# Patient Record
Sex: Female | Born: 1990 | Hispanic: Yes | Marital: Married | Smoking: Former smoker
Health system: Southern US, Community
[De-identification: ages and names within clinical notes are randomized; demographics above are authoritative.]

## PROBLEM LIST (undated history)

## (undated) ENCOUNTER — Inpatient Hospital Stay (HOSPITAL_COMMUNITY): Payer: Self-pay

## (undated) DIAGNOSIS — T7840XA Allergy, unspecified, initial encounter: Secondary | ICD-10-CM

## (undated) HISTORY — PX: NO PAST SURGERIES: SHX2092

## (undated) HISTORY — DX: Allergy, unspecified, initial encounter: T78.40XA

---

## 2012-09-15 ENCOUNTER — Ambulatory Visit: Payer: Self-pay | Admitting: Family Medicine

## 2013-03-01 ENCOUNTER — Other Ambulatory Visit (HOSPITAL_COMMUNITY)
Admission: RE | Admit: 2013-03-01 | Discharge: 2013-03-01 | Disposition: A | Discharge status: Home / Self Care | Payer: Medicaid Other | Source: Ambulatory Visit | Attending: Family Medicine | Admitting: Family Medicine

## 2013-03-01 ENCOUNTER — Ambulatory Visit: Payer: Medicaid Other | Attending: Family Medicine | Admitting: Family Medicine

## 2013-03-01 ENCOUNTER — Encounter: Payer: Self-pay | Admitting: Family Medicine

## 2013-03-01 VITALS — BP 125/78 | HR 71 | Temp 98.5°F | Resp 14 | Ht 61.0 in | Wt 185.0 lb

## 2013-03-01 DIAGNOSIS — Z7253 High risk bisexual behavior: Secondary | ICD-10-CM | POA: Insufficient documentation

## 2013-03-01 DIAGNOSIS — Z7251 High risk heterosexual behavior: Secondary | ICD-10-CM

## 2013-03-01 DIAGNOSIS — N760 Acute vaginitis: Secondary | ICD-10-CM | POA: Insufficient documentation

## 2013-03-01 DIAGNOSIS — N898 Other specified noninflammatory disorders of vagina: Secondary | ICD-10-CM

## 2013-03-01 DIAGNOSIS — Z309 Encounter for contraceptive management, unspecified: Secondary | ICD-10-CM

## 2013-03-01 DIAGNOSIS — N920 Excessive and frequent menstruation with regular cycle: Secondary | ICD-10-CM

## 2013-03-01 LAB — CBC WITH DIFFERENTIAL/PLATELET
Basophils Absolute: 0 10*3/uL (ref 0.0–0.1)
Basophils Relative: 0 % (ref 0–1)
HCT: 38.8 % (ref 36.0–46.0)
MCHC: 34 g/dL (ref 30.0–36.0)
Monocytes Absolute: 0.3 10*3/uL (ref 0.1–1.0)
Neutro Abs: 3 10*3/uL (ref 1.7–7.7)
Neutrophils Relative %: 59 % (ref 43–77)
RDW: 13.9 % (ref 11.5–15.5)

## 2013-03-01 LAB — POCT URINALYSIS DIPSTICK
Leukocytes, UA: NEGATIVE
Nitrite, UA: NEGATIVE
Protein, UA: NEGATIVE
Urobilinogen, UA: 1
pH, UA: 7.5

## 2013-03-01 LAB — HIV ANTIBODY (ROUTINE TESTING W REFLEX): HIV: NONREACTIVE

## 2013-03-01 MED ORDER — NORGESTIM-ETH ESTRAD TRIPHASIC 0.18/0.215/0.25 MG-35 MCG PO TABS: 1.0000 | ORAL_TABLET | Freq: Every day | ORAL | Status: DC

## 2013-03-01 NOTE — Progress Notes (Signed)
Subjective:     Patient ID: Dominique Stewart, female   DOB: 1991/04/29, 22 y.o.   MRN: [id number]  HPI Pt here to establish care.  She initially said her primary concern was needing a "pap smear" which actually turned out to be that she never returned to the [facility] doctor for her 6 week check up after her delivery last July. On further discussion, it turne dout her main concern today is actually that she and the father of her baby had a 3 month breakup within the past year and he was sleeping with other women so she is worried about STDs. Since that time, they have gotten back together, had unprotected sex, and neither has been checked for stds.   Her delivery was, per her, uncomplicated nsvd, full term baby girl. She breastfed for 1 month and then switched to formula, at which time her periods returned. They are q monthly now and had been quite heavy for the first 6 mos but now are lightening back up toward the flow she had pre-pregnancy. She uses super tampons but they last 4-6 hours each.   She has had some increased discharge the past 2 mos and noticed a fishy odor. No abnormal bleeding or pain.    Review of Systems per hpi     Objective:   Physical Exam  Nursing note and vitals reviewed. Constitutional: She appears well-developed and well-nourished.  Cardiovascular: Normal rate, regular rhythm and normal heart sounds.   Pulmonary/Chest: Effort normal and breath sounds normal.  Abdominal: Soft.  Genitourinary: Uterus normal. Vaginal discharge found.  Psychiatric: She has a normal mood and affect.       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 Checking cbc today to eval for anemia.  Starting on trisprintec for now - she is interested in nexplanon as well.   No pap needed until age 51 Will request records, as she is unsure of her immunization status - specifically want to ensure pertussis and mmr utd.  Will have her rtc in 2-3 mos, earlier if needed.

## 2013-03-01 NOTE — Progress Notes (Signed)
Patient asks for pelvic exam. Has not had menstruhal cycle since giving birth to daughter 1 year ago. Denies cramping or spotting. States has not breast fed since daughter was 52 month old.

## 2013-03-02 LAB — ACUTE HEP PANEL AND HEP B SURFACE AB
HCV Ab: NEGATIVE
Hep A IgM: NEGATIVE

## 2013-03-03 LAB — GC/CHLAMYDIA PROBE AMP: GC Probe RNA: NEGATIVE

## 2013-03-05 ENCOUNTER — Telehealth: Payer: Self-pay | Admitting: *Deleted

## 2013-03-05 NOTE — Telephone Encounter (Signed)
03/05/13 Attempt to reach patient unavailable at number  [phone] or [phone] Dominique Stewart

## 2013-03-06 ENCOUNTER — Telehealth: Payer: Self-pay | Admitting: *Deleted

## 2013-03-06 DIAGNOSIS — A749 Chlamydial infection, unspecified: Secondary | ICD-10-CM

## 2013-03-06 MED ORDER — AZITHROMYCIN 250 MG PO TABS: ORAL_TABLET | ORAL | Status: DC

## 2013-03-06 NOTE — Progress Notes (Signed)
03/06/13 Spoke with patient regarding lab made aware Chlamydia test came back positive.Patient is not allergic To azithromycin. Patient stated to send prescription to [facility] on [address]  . [phone]. Patient informed she will need to take 1000mg  all at once and her partner need to be treated as well . Patient informed  NOT to have intercourse with her partner until she and they have been treated.Patient informed if symptoms persist She should let us know so we can do a test of cure/retreat if needed.Verbalize and understands.Patient also stated That her husband is a patient here and was seen at the clinic on Friday 03/02/13 . P.[facility] BSN [facility]

## 2013-03-06 NOTE — Telephone Encounter (Signed)
Please let pt know I have sent in her azithromycin. What is husband's status on treatment? If he has not been treated yet, he needs to be. He can speak with [place] on the phone to verify his information and we can send in his prescription if he would like to do so.

## 2013-03-06 NOTE — Telephone Encounter (Signed)
03/06/13 Spoke with patient and made aware Chlamydia test came back positive. Patient stated  She is not  Allergic to Azithromycin . She use [facility] on [address] [phone] . Need to take 1000mg  All at once one time and her partner need to be treated as well and not th have  Intercourse with them until she  And partner have been treated.If symptoms persist she should let us know  So we can do a test of cure/retreat If needed.Patient verbalize and understands.Patient also stated that her husband was seen her at  This clinic on Friday  03/02/13 please make doctor aware. P.Dellas Guard,RN BSN MHA

## 2013-03-06 NOTE — Telephone Encounter (Signed)
Pt called back. °

## 2013-03-07 NOTE — Telephone Encounter (Signed)
03/07/13 Spoke with patient made aware that her prescription had been sent in to [facility] on [address] Have her husband call the clinic to verify information per Dr. Lucretia Roers and she will call him in a prescription for treatment. Patient stated husband will call back today around 12:30 . Sherlene Shams BSN MHA

## 2013-03-14 ENCOUNTER — Other Ambulatory Visit: Payer: Self-pay | Admitting: Family Medicine

## 2013-03-14 DIAGNOSIS — N760 Acute vaginitis: Secondary | ICD-10-CM

## 2013-03-14 MED ORDER — METRONIDAZOLE 500 MG PO TABS: 500.0000 mg | ORAL_TABLET | Freq: Two times a day (BID) | ORAL | Status: DC

## 2013-03-16 ENCOUNTER — Telehealth: Payer: Self-pay | Admitting: *Deleted

## 2013-03-16 NOTE — Telephone Encounter (Signed)
03/16/13 Unable to reach patient at number provided. P.Verenis Nicosia,RN BSN MHA

## 2013-03-19 ENCOUNTER — Telehealth: Payer: Self-pay | Admitting: *Deleted

## 2013-03-19 NOTE — Telephone Encounter (Signed)
03/19/13 Patient made aware of bacterial infection prescription called into  [facility] on [address] per pt. Request. P.Gray Maugeri,RN BSN MHA

## 2013-03-21 ENCOUNTER — Ambulatory Visit: Payer: Medicaid Other

## 2013-04-11 ENCOUNTER — Ambulatory Visit: Payer: Medicaid Other

## 2013-06-14 ENCOUNTER — Encounter (HOSPITAL_COMMUNITY): Payer: Self-pay | Admitting: *Deleted

## 2013-06-14 ENCOUNTER — Inpatient Hospital Stay (HOSPITAL_COMMUNITY): Payer: Medicaid Other

## 2013-06-14 ENCOUNTER — Inpatient Hospital Stay (HOSPITAL_COMMUNITY)
Admission: AD | Admit: 2013-06-14 | Discharge: 2013-06-14 | Disposition: A | Discharge status: Home / Self Care | Payer: Medicaid Other | Source: Ambulatory Visit | Attending: Obstetrics & Gynecology | Admitting: Obstetrics & Gynecology

## 2013-06-14 DIAGNOSIS — O368190 Decreased fetal movements, unspecified trimester, not applicable or unspecified: Secondary | ICD-10-CM | POA: Insufficient documentation

## 2013-06-14 DIAGNOSIS — O0932 Supervision of pregnancy with insufficient antenatal care, second trimester: Secondary | ICD-10-CM

## 2013-06-14 NOTE — MAU Provider Note (Signed)
  History   Dominique Stewart is a 22 year old G2P1000 currently [redacted]w[redacted]d presenting for lack of fetal movement. She states that she has not felt movement at all during her pregnancy and was concerned because she felt fetal movements as early as 17 weeks in her previous pregnancy. She has not had prenatal care yet and is scheduled for her first prenatal visit here on October 26th. She denies any abdominal pain, nausea, vomiting, urinary symptoms, vaginal bleeding, or discharge  CSN: [id number]  Arrival date and time: 06/14/13 4696   First Provider Initiated Contact with Patient 06/14/13 1002      No chief complaint on file.  HPI    Past Medical History  Diagnosis Date  . Allergy     History reviewed. No pertinent past surgical history.  Family History  Problem Relation Age of Onset  . Cancer Paternal Grandmother   . Diabetes Paternal Grandmother     History  Substance Use Topics  . Smoking status: Former Games developer  . Smokeless tobacco: Not on file  . Alcohol Use: No    Allergies: No Known Allergies  Prescriptions prior to admission  Medication Sig Dispense Refill  . Prenatal Vit-Fe Fumarate-FA (PRENATAL MULTIVITAMIN) TABS tablet Take 1 tablet by mouth daily at 12 noon.        Review of Systems  Constitutional: Negative for fever and chills.  Eyes: Negative for blurred vision.  Respiratory: Negative for cough and shortness of breath.   Cardiovascular: Negative for chest pain and leg swelling.  Gastrointestinal: Positive for heartburn. Negative for nausea, vomiting, abdominal pain and diarrhea.  Genitourinary: Negative for dysuria, urgency, frequency, hematuria and flank pain.  Neurological: Negative for dizziness and headaches.   Physical Exam   Blood pressure 124/64, pulse 96, temperature 97.9 F (36.6 C), temperature source Oral, resp. rate 18, height 5' (1.524 m), weight 79.833 kg (176 lb), last menstrual period 02/01/2012, SpO2 98.00%.  Physical Exam  Nursing note and  vitals reviewed. Constitutional: She is oriented to person, place, and time. She appears well-developed and well-nourished. No distress.  HENT:  Head: Normocephalic.  Eyes: Pupils are equal, round, and reactive to light.  Neck: Normal range of motion. Neck supple.  Cardiovascular: Normal rate, regular rhythm, normal heart sounds and intact distal pulses.   Respiratory: Effort normal and breath sounds normal.  GI: Soft. Bowel sounds are normal. There is no tenderness.  Gravid  FHR 155 bpm with doppler  Musculoskeletal: Normal range of motion.  Neurological: She is alert and oriented to person, place, and time.  Skin: Skin is warm and dry.  Psychiatric: She has a normal mood and affect.    MAU Course  Procedures MDM Ultrasound performed for dating- results not available at time of discharge Pt will call back for results to see when anatomy scan should be performed Preliminary report measure [redacted]w[redacted]d, FHR 150 bpm; placenta posterior above cervical os Discussed report with pt on phone-order for anatomy scan put in - radiology will call pt for appt  Pt will keep appointment in OB clinic on Oct 23 Assessment and Plan  Assessment: Single living IUP [redacted]w[redacted]d Will schedule out pt ultrasound for anatomy scan~ Sept 22- ultrasound to call pt Will have pt to f/u at [facility] Pamelia Hoit, WHNP-BC  Pt seen with Coy Saunas, PA student and agree with his assessment and plan Coy Saunas 06/14/2013, 10:04 AM

## 2013-06-14 NOTE — MAU Note (Signed)
Patient reports to MAU with c/o of "not feeling my baby move". Denies bleeding or LOF.

## 2013-06-21 ENCOUNTER — Encounter: Payer: Self-pay | Admitting: *Deleted

## 2013-06-21 DIAGNOSIS — O0930 Supervision of pregnancy with insufficient antenatal care, unspecified trimester: Secondary | ICD-10-CM | POA: Insufficient documentation

## 2013-06-22 ENCOUNTER — Telehealth: Payer: Self-pay | Admitting: *Deleted

## 2013-06-22 DIAGNOSIS — O0931 Supervision of pregnancy with insufficient antenatal care, first trimester: Secondary | ICD-10-CM

## 2013-06-22 NOTE — Telephone Encounter (Signed)
Wave called and left a message stating she was seen In MAU and was told to schedule an anatamy ultrasound. Per review had limited ultrasound confirming dates , was [redacted] weeks pregnant. Has appt scheduled for clinic in October. Called [place] and states Darl Pikes, NP called her and told her to schedule [place]. Told her I would discuss with provider and we will call her back.

## 2013-06-22 NOTE — Telephone Encounter (Signed)
Scheduled [place] and called patient and gave her the appt.

## 2013-06-26 ENCOUNTER — Ambulatory Visit (HOSPITAL_COMMUNITY)
Admission: RE | Admit: 2013-06-26 | Discharge: 2013-06-26 | Disposition: A | Discharge status: Home / Self Care | Payer: Medicaid Other | Source: Ambulatory Visit | Attending: Gynecology | Admitting: Gynecology

## 2013-06-26 ENCOUNTER — Other Ambulatory Visit (HOSPITAL_COMMUNITY): Payer: Self-pay | Admitting: Gynecology

## 2013-06-26 DIAGNOSIS — O0932 Supervision of pregnancy with insufficient antenatal care, second trimester: Secondary | ICD-10-CM

## 2013-06-26 DIAGNOSIS — O0930 Supervision of pregnancy with insufficient antenatal care, unspecified trimester: Secondary | ICD-10-CM | POA: Insufficient documentation

## 2013-06-26 DIAGNOSIS — Z3689 Encounter for other specified antenatal screening: Secondary | ICD-10-CM | POA: Insufficient documentation

## 2013-06-28 ENCOUNTER — Other Ambulatory Visit (HOSPITAL_COMMUNITY): Payer: Medicaid Other

## 2013-07-18 ENCOUNTER — Encounter: Payer: Medicaid Other | Admitting: Advanced Practice Midwife

## 2013-07-31 ENCOUNTER — Other Ambulatory Visit (HOSPITAL_COMMUNITY)
Admission: RE | Admit: 2013-07-31 | Discharge: 2013-07-31 | Disposition: A | Discharge status: Home / Self Care | Payer: Medicaid Other | Source: Ambulatory Visit | Attending: Family Medicine | Admitting: Family Medicine

## 2013-07-31 ENCOUNTER — Ambulatory Visit (INDEPENDENT_AMBULATORY_CARE_PROVIDER_SITE_OTHER): Payer: Medicaid Other | Admitting: Family Medicine

## 2013-07-31 ENCOUNTER — Encounter: Payer: Self-pay | Admitting: Family Medicine

## 2013-07-31 VITALS — BP 117/71 | Temp 97.0°F | Wt 183.3 lb

## 2013-07-31 DIAGNOSIS — B86 Scabies: Secondary | ICD-10-CM

## 2013-07-31 DIAGNOSIS — O0932 Supervision of pregnancy with insufficient antenatal care, second trimester: Secondary | ICD-10-CM

## 2013-07-31 DIAGNOSIS — Z01419 Encounter for gynecological examination (general) (routine) without abnormal findings: Secondary | ICD-10-CM | POA: Insufficient documentation

## 2013-07-31 DIAGNOSIS — Z124 Encounter for screening for malignant neoplasm of cervix: Secondary | ICD-10-CM

## 2013-07-31 DIAGNOSIS — N898 Other specified noninflammatory disorders of vagina: Secondary | ICD-10-CM

## 2013-07-31 DIAGNOSIS — Z113 Encounter for screening for infections with a predominantly sexual mode of transmission: Secondary | ICD-10-CM

## 2013-07-31 DIAGNOSIS — Z23 Encounter for immunization: Secondary | ICD-10-CM

## 2013-07-31 DIAGNOSIS — O0930 Supervision of pregnancy with insufficient antenatal care, unspecified trimester: Secondary | ICD-10-CM

## 2013-07-31 LAB — POCT URINALYSIS DIP (DEVICE)
Bilirubin Urine: NEGATIVE
Glucose, UA: NEGATIVE mg/dL
Protein, ur: NEGATIVE mg/dL
Specific Gravity, Urine: 1.025 (ref 1.005–1.030)
Urobilinogen, UA: 0.2 mg/dL (ref 0.0–1.0)

## 2013-07-31 MED ORDER — TETANUS-DIPHTH-ACELL PERTUSSIS 5-2.5-18.5 LF-MCG/0.5 IM SUSP: 0.5000 mL | Freq: Once | INTRAMUSCULAR | Status: DC

## 2013-07-31 MED ORDER — PERMETHRIN 5 % EX CREA: 1.0000 "application " | TOPICAL_CREAM | Freq: Once | CUTANEOUS | Status: DC

## 2013-07-31 NOTE — Progress Notes (Signed)
P= 83 Pt. States she was seen two weeks ago at [facility] and was tested for HIV, gonhorrhea, syphillis and chlamydia. Pt. States she tested positive for chlamydia. Pt. States she was told to see a doctor.  New OB packet given to patient.  Discussed appropriate weight gain based on BMI for this pregancy; pt. Verbalizes understanding.  Pt. Consented to flu vacine and tdap.

## 2013-07-31 NOTE — Progress Notes (Signed)
Subjective:    Dominique Stewart is a G2P1000 [redacted]w[redacted]d being seen today for her first obstetrical visit.  Her obstetrical history is significant for late to care. Patient does intend to breast feed. Pregnancy history fully reviewed.  Patient reports backache, no bleeding, no contractions, no cramping and no leaking.  Pt with diffuse papular rash and very itchy.   Filed Vitals:   07/31/13 1312  BP: 117/71  Temp: 97 F (36.1 C)  Weight: 83.144 kg (183 lb 4.8 oz)    HISTORY: OB History  Gravida Para Term Preterm AB SAB TAB Ectopic Multiple Living  2 1 1            # Outcome Date GA Lbr Len/2nd Weight Sex Delivery Anes PTL Lv  2 CUR           1 TRM 03/28/12    F SVD        Past Medical History  Diagnosis Date  . Allergy    History reviewed. No pertinent past surgical history. Family History  Problem Relation Age of Onset  . Cancer Paternal Grandmother   . Diabetes Paternal Grandmother      Exam    Uterus:     Pelvic Exam:    Perineum: No Hemorrhoids   Vulva: normal   Vagina:  normal mucosa   pH:    Cervix: no bleeding following Pap and no cervical motion tenderness   Adnexa: normal adnexa and no mass, fullness, tenderness   Bony Pelvis: proven to 7lb 12oz  System:     Skin: normal coloration and turgor, pt with papular rash on hands and on legs.     Neurologic: oriented, normal, negative, normal mood, gait normal; reflexes normal and symmetric   Extremities: normal strength, tone, and muscle mass, no deformities   HEENT extra ocular movement intact and sclera clear, anicteric   Mouth/Teeth mucous membranes moist, pharynx normal without lesions   Neck supple and no masses   Cardiovascular: regular rate and rhythm, no murmurs or gallops   Respiratory:  appears well, vitals normal, no respiratory distress, acyanotic, normal RR, ear and throat exam is normal, neck free of mass or lymphadenopathy, chest clear, no wheezing, crepitations, rhonchi, normal symmetric air  entry   Abdomen: soft, non-tender; bowel sounds normal; no masses,  no organomegaly   Urinary: urethral meatus normal  FHT 140s 27cm    Assessment:    Pregnancy: G2P1000 Patient Active Problem List   Diagnosis Date Noted  . Insufficient prenatal care 06/21/2013        Plan:      Dominique Stewart is a 22 y.o. G2P1000 at [redacted]w[redacted]d here for ROB visit.  Pt reports +Chlymdia, will follow up with pap today and treat as needed  ?Scabies: will treat with permethrin and check back for improvement.  Discussed with Patient:  -Plans to breast feed.  All questions answered. -Continue prenatal vitamins. - Reviewed fetal kick counts (Pt to perform daily at a time when the baby is active, lie laterally with both hands on belly in quiet room and count all movements (hiccups, shoulder rolls, obvious kicks, etc); pt is to report to clinic or MAU for less than 10 movements felt in a one hour time period-pt told as soon as she counts 10 movements the count is complete.)  - Routine precautions discussed (depression, infection s/s).   Patient provided with all pertinent phone numbers for emergencies. - RTC for any VB, regular, painful cramps/ctxs occurring at a rate of >2/10  min, fever (100.5 or higher), n/v/d, any pain that is unresolving or worsening, LOF, decreased fetal movement, CP, SOB, edema - f/u 2 weeks for reevaluation for rash and GC/C treatment.  Problems: Patient Active Problem List   Diagnosis Date Noted  . Insufficient prenatal care 06/21/2013    To Do: 1. PNL, 2. PNV  [ ]  Vaccines: Flu: 11/4  Tdap:  [ ]  BCM: undecided  Edu: [ x] PTL precautions; [ ]  BF class; [ ]  childbirth class; [ ]   BF counseling;  Dominique Stewart 07/31/2013

## 2013-07-31 NOTE — Patient Instructions (Addendum)
Pregnancy - Second Trimester The second trimester of pregnancy (3 to 6 months) is a period of rapid growth for you and your baby. At the end of the sixth month, your baby is about 9 inches long and weighs 1 1/2 pounds. You will begin to feel the baby move between 18 and 20 weeks of the pregnancy. This is called quickening. Weight gain is faster. A clear fluid (colostrum) may leak out of your breasts. You may feel small contractions of the womb (uterus). This is known as false labor or Braxton-Hicks contractions. This is like a practice for labor when the baby is ready to be born. Usually, the problems with morning sickness have usually passed by the end of your first trimester. Some women develop small dark blotches (called cholasma, mask of pregnancy) on their face that usually goes away after the baby is born. Exposure to the sun makes the blotches worse. Acne may also develop in some pregnant women and pregnant women who have acne, may find that it goes away. PRENATAL EXAMS  Blood work may continue to be done during prenatal exams. These tests are done to check on your health and the probable health of your baby. Blood work is used to follow your blood levels (hemoglobin). Anemia (low hemoglobin) is common during pregnancy. Iron and vitamins are given to help prevent this. You will also be checked for diabetes between 24 and 28 weeks of the pregnancy. Some of the previous blood tests may be repeated.  The size of the uterus is measured during each visit. This is to make sure that the baby is continuing to grow properly according to the dates of the pregnancy.  Your blood pressure is checked every prenatal visit. This is to make sure you are not getting toxemia.  Your urine is checked to make sure you do not have an infection, diabetes or protein in the urine.  Your weight is checked often to make sure gains are happening at the suggested rate. This is to ensure that both you and your baby are  growing normally.  Sometimes, an ultrasound is performed to confirm the proper growth and development of the baby. This is a test which bounces harmless sound waves off the baby so your caregiver can more accurately determine due dates. Sometimes, a test is done on the amniotic fluid surrounding the baby. This test is called an amniocentesis. The amniotic fluid is obtained by sticking a needle into the belly (abdomen). This is done to check the chromosomes in instances where there is a concern about possible genetic problems with the baby. It is also sometimes done near the end of pregnancy if an early delivery is required. In this case, it is done to help make sure the baby's lungs are mature enough for the baby to live outside of the womb. CHANGES OCCURING IN THE SECOND TRIMESTER OF PREGNANCY Your body goes through many changes during pregnancy. They vary from person to person. Talk to your caregiver about changes you notice that you are concerned about.  During the second trimester, you will likely have an increase in your appetite. It is normal to have cravings for certain foods. This varies from person to person and pregnancy to pregnancy.  Your lower abdomen will begin to bulge.  You may have to urinate more often because the uterus and baby are pressing on your bladder. It is also common to get more bladder infections during pregnancy. You can help this by drinking lots of fluids   and emptying your bladder before and after intercourse.  You may begin to get stretch marks on your hips, abdomen, and breasts. These are normal changes in the body during pregnancy. There are no exercises or medicines to take that prevent this change.  You may begin to develop swollen and bulging veins (varicose veins) in your legs. Wearing support hose, elevating your feet for 15 minutes, 3 to 4 times a day and limiting salt in your diet helps lessen the problem.  Heartburn may develop as the uterus grows and  pushes up against the stomach. Antacids recommended by your caregiver helps with this problem. Also, eating smaller meals 4 to 5 times a day helps.  Constipation can be treated with a stool softener or adding bulk to your diet. Drinking lots of fluids, and eating vegetables, fruits, and whole grains are helpful.  Exercising is also helpful. If you have been very active up until your pregnancy, most of these activities can be continued during your pregnancy. If you have been less active, it is helpful to start an exercise program such as walking.  Hemorrhoids may develop at the end of the second trimester. Warm sitz baths and hemorrhoid cream recommended by your caregiver helps hemorrhoid problems.  Backaches may develop during this time of your pregnancy. Avoid heavy lifting, wear low heal shoes, and practice good posture to help with backache problems.  Some pregnant women develop tingling and numbness of their hand and fingers because of swelling and tightening of ligaments in the wrist (carpel tunnel syndrome). This goes away after the baby is born.  As your breasts enlarge, you may have to get a bigger bra. Get a comfortable, cotton, support bra. Do not get a nursing bra until the last month of the pregnancy if you will be nursing the baby.  You may get a dark line from your belly button to the pubic area called the linea nigra.  You may develop rosy cheeks because of increase blood flow to the face.  You may develop spider looking lines of the face, neck, arms, and chest. These go away after the baby is born. HOME CARE INSTRUCTIONS   It is extremely important to avoid all smoking, herbs, alcohol, and unprescribed drugs during your pregnancy. These chemicals affect the formation and growth of the baby. Avoid these chemicals throughout the pregnancy to ensure the delivery of a healthy infant.  Most of your home care instructions are the same as suggested for the first trimester of your  pregnancy. Keep your caregiver's appointments. Follow your caregiver's instructions regarding medicine use, exercise, and diet.  During pregnancy, you are providing food for you and your baby. Continue to eat regular, well-balanced meals. Choose foods such as meat, fish, milk and other low fat dairy products, vegetables, fruits, and whole-grain breads and cereals. Your caregiver will tell you of the ideal weight gain.  A physical sexual relationship may be continued up until near the end of pregnancy if there are no other problems. Problems could include early (premature) leaking of amniotic fluid from the membranes, vaginal bleeding, abdominal pain, or other medical or pregnancy problems.  Exercise regularly if there are no restrictions. Check with your caregiver if you are unsure of the safety of some of your exercises. The greatest weight gain will occur in the last 2 trimesters of pregnancy. Exercise will help you:  Control your weight.  Get you in shape for labor and delivery.  Lose weight after you have the baby.  Wear   a good support or jogging bra for breast tenderness during pregnancy. This may help if worn during sleep. Pads or tissues may be used in the bra if you are leaking colostrum.  Do not use hot tubs, steam rooms or saunas throughout the pregnancy.  Wear your seat belt at all times when driving. This protects you and your baby if you are in an accident.  Avoid raw meat, uncooked cheese, cat litter boxes, and soil used by cats. These carry germs that can cause birth defects in the baby.  The second trimester is also a good time to visit your dentist for your dental health if this has not been done yet. Getting your teeth cleaned is okay. Use a soft toothbrush. Brush gently during pregnancy.  It is easier to leak urine during pregnancy. Tightening up and strengthening the pelvic muscles will help with this problem. Practice stopping your urination while you are going to the  bathroom. These are the same muscles you need to strengthen. It is also the muscles you would use as if you were trying to stop from passing gas. You can practice tightening these muscles up 10 times a set and repeating this about 3 times per day. Once you know what muscles to tighten up, do not perform these exercises during urination. It is more likely to contribute to an infection by backing up the urine.  Ask for help if you have financial, counseling, or nutritional needs during pregnancy. Your caregiver will be able to offer counseling for these needs as well as refer you for other special needs.  Your skin may become oily. If so, wash your face with mild soap, use non-greasy moisturizer and oil or cream based makeup. MEDICINES AND DRUG USE IN PREGNANCY  Take prenatal vitamins as directed. The vitamin should contain 1 milligram of folic acid. Keep all vitamins out of reach of children. Only a couple vitamins or tablets containing iron may be fatal to a baby or young child when ingested.  Avoid use of all medicines, including herbs, over-the-counter medicines, not prescribed or suggested by your caregiver. Only take over-the-counter or prescription medicines for pain, discomfort, or fever as directed by your caregiver. Do not use aspirin.  Let your caregiver also know about herbs you may be using.  Alcohol is related to a number of birth defects. This includes fetal alcohol syndrome. All alcohol, in any form, should be avoided completely. Smoking will cause low birth rate and premature babies.  Street or illegal drugs are very harmful to the baby. They are absolutely forbidden. A baby born to an addicted mother will be addicted at birth. The baby will go through the same withdrawal an adult does. SEEK MEDICAL CARE IF:  You have any concerns or worries during your pregnancy. It is better to call with your questions if you feel they cannot wait, rather than worry about them. SEEK IMMEDIATE  MEDICAL CARE IF:   An unexplained oral temperature above 102 F (38.9 C) develops, or as your caregiver suggests.  You have leaking of fluid from the vagina (birth canal). If leaking membranes are suspected, take your temperature and tell your caregiver of this when you call.  There is vaginal spotting, bleeding, or passing clots. Tell your caregiver of the amount and how many pads are used. Light spotting in pregnancy is common, especially following intercourse.  You develop a bad smelling vaginal discharge with a change in the color from clear to white.  You continue to feel   sick to your stomach (nauseated) and have no relief from remedies suggested. You vomit blood or coffee ground-like materials.  You lose more than 2 pounds of weight or gain more than 2 pounds of weight over 1 week, or as suggested by your caregiver.  You notice swelling of your face, hands, feet, or legs.  You get exposed to [place] measles and have never had them.  You are exposed to fifth disease or chickenpox.  You develop belly (abdominal) pain. Round ligament discomfort is a common non-cancerous (benign) cause of abdominal pain in pregnancy. Your caregiver still must evaluate you.  You develop a bad headache that does not go away.  You develop fever, diarrhea, pain with urination, or shortness of breath.  You develop visual problems, blurry, or double vision.  You fall or are in a car accident or any kind of trauma.  There is mental or physical violence at home. Document Released: 09/07/2001 Document Revised: 06/07/2012 Document Reviewed: 03/12/2009 [facility] Patient Information 2014 [place], [place]. Scabies Scabies are small bugs (mites) that burrow under the skin and cause red bumps and severe itching. These bugs can only be seen with a microscope. Scabies are highly contagious. They can spread easily from person to person by direct contact. They are also spread through sharing clothing or linens that  have the scabies mites living in them. It is not unusual for an entire family to become infected through shared towels, clothing, or bedding.  HOME CARE INSTRUCTIONS  Your caregiver may prescribe a cream or lotion to kill the mites. If cream is prescribed, massage the cream into the entire body from the neck to the bottom of both feet. Also massage the cream into the scalp and face if your child is less than 38 year old. Avoid the eyes and mouth. Do not wash your hands after application. Leave the cream on for 8 to 12 hours. Your child should bathe or shower after the 8 to 12 hour application period. Sometimes it is helpful to apply the cream to your child right before bedtime. One treatment is usually effective and will eliminate approximately 95% of infestations. For severe cases, your caregiver may decide to repeat the treatment in 1 week. Everyone in your household should be treated with one application of the cream. New rashes or burrows should not appear within 24 to 48 hours after successful treatment. However, the itching and rash may last for 2 to 4 weeks after successful treatment. Your caregiver may prescribe a medicine to help with the itching or to help the rash go away more quickly. Scabies can live on clothing or linens for up to 3 days. All of your child's recently used clothing, towels, stuffed toys, and bed linens should be washed in hot water and then dried in a dryer for at least 20 minutes on high heat. Items that cannot be washed should be enclosed in a plastic bag for at least 3 days. To help relieve itching, bathe your child in a cool bath or apply cool washcloths to the affected areas. Your child may return to school after treatment with the prescribed cream. SEEK MEDICAL CARE IF:  The itching persists longer than 4 weeks after treatment. The rash spreads or becomes infected. Signs of infection include red blisters or yellow-tan crust. Document Released: 09/13/2005 Document  Revised: 12/06/2011 Document Reviewed: 01/22/2009 [facility] Patient Information 2014 [place], [place].

## 2013-08-01 ENCOUNTER — Encounter: Payer: Self-pay | Admitting: *Deleted

## 2013-08-01 LAB — PRESCRIPTION MONITORING PROFILE (19 PANEL)
Barbiturate Screen, Urine: NEGATIVE ng/mL
Benzodiazepine Screen, Urine: NEGATIVE ng/mL
Buprenorphine, Urine: NEGATIVE ng/mL
Fentanyl, Ur: NEGATIVE ng/mL
MDMA URINE: NEGATIVE ng/mL
Meperidine, Ur: NEGATIVE ng/mL
Nitrites, Initial: NEGATIVE ug/mL
Oxycodone Screen, Ur: NEGATIVE ng/mL
Propoxyphene: NEGATIVE ng/mL
Tramadol Scrn, Ur: NEGATIVE ng/mL
Zolpidem, Urine: NEGATIVE ng/mL
pH, Initial: 6.9 pH (ref 4.5–8.9)

## 2013-08-01 LAB — OBSTETRIC PANEL
Basophils Absolute: 0 10*3/uL (ref 0.0–0.1)
Basophils Relative: 0 % (ref 0–1)
Eosinophils Absolute: 0.3 10*3/uL (ref 0.0–0.7)
Eosinophils Relative: 3 % (ref 0–5)
HCT: 35.2 % — ABNORMAL LOW (ref 36.0–46.0)
Hemoglobin: 12.2 g/dL (ref 12.0–15.0)
Lymphocytes Relative: 27 % (ref 12–46)
MCV: 89.6 fL (ref 78.0–100.0)
Monocytes Relative: 7 % (ref 3–12)
Platelets: 275 10*3/uL (ref 150–400)
RDW: 13.7 % (ref 11.5–15.5)
WBC: 8.4 10*3/uL (ref 4.0–10.5)

## 2013-08-01 LAB — CULTURE, OB URINE
Colony Count: NO GROWTH
Organism ID, Bacteria: NO GROWTH

## 2013-08-01 LAB — ALCOHOL METABOLITE (ETG), URINE: Ethyl Glucuronide (EtG): NEGATIVE ng/mL

## 2013-08-01 LAB — HIV ANTIBODY (ROUTINE TESTING W REFLEX): HIV: NONREACTIVE

## 2013-08-02 ENCOUNTER — Telehealth: Payer: Self-pay | Admitting: Family Medicine

## 2013-08-02 MED ORDER — AZITHROMYCIN 500 MG PO TABS: 1000.0000 mg | ORAL_TABLET | Freq: Once | ORAL | Status: DC

## 2013-08-02 NOTE — Telephone Encounter (Signed)
+   Chlamydia - in need of tx. Message sent for clinical support. Azithryomycin 1000mg  x1  Tawana Scale, MD OB Fellow

## 2013-08-03 LAB — HEMOGLOBINOPATHY EVALUATION
Hgb A: 96.8 % (ref 96.8–97.8)
Hgb S Quant: 0 %

## 2013-08-03 MED ORDER — AZITHROMYCIN 500 MG PO TABS: ORAL_TABLET | ORAL | Status: DC

## 2013-08-03 NOTE — Telephone Encounter (Addendum)
Called pt and left message we are trying to reach her with results information. Please call back and state whether we may leave details of medical information on her voice mail.  Rx sent electronically to her pharmacy. STD form to be completed when pt has called back and confirmed. 11/11  1215  Called and spoke w/pt. I informed her of + Chlamydia test results and need for treatment.  I also advised that her husband needs treatment as well. They should both refrain from sexual intercourse for 2 weeks after their antibiotic treatment.  Pt also stated that she needs more cream for the scabies. I informed her that she has refills available from her pharmacy.  She thinks her husband is now getting scabies also. I advised her that he will need to be seen at the [facility] or Urgent Care.  Pt voiced understanding of all information and instructions given.   STD form completed and faxed to [facility].

## 2013-08-07 MED ORDER — AZITHROMYCIN 500 MG PO TABS: ORAL_TABLET | ORAL | Status: DC

## 2013-08-14 ENCOUNTER — Encounter: Payer: Medicaid Other | Admitting: Obstetrics and Gynecology

## 2013-09-04 ENCOUNTER — Encounter: Payer: Self-pay | Admitting: Family Medicine

## 2013-09-04 ENCOUNTER — Ambulatory Visit (INDEPENDENT_AMBULATORY_CARE_PROVIDER_SITE_OTHER): Payer: Medicaid Other | Admitting: Family Medicine

## 2013-09-04 VITALS — BP 119/80 | Wt 193.0 lb

## 2013-09-04 DIAGNOSIS — O0930 Supervision of pregnancy with insufficient antenatal care, unspecified trimester: Secondary | ICD-10-CM

## 2013-09-04 DIAGNOSIS — O0933 Supervision of pregnancy with insufficient antenatal care, third trimester: Secondary | ICD-10-CM

## 2013-09-04 LAB — CBC
HCT: 34.4 % — ABNORMAL LOW (ref 36.0–46.0)
MCH: 29.4 pg (ref 26.0–34.0)
MCHC: 33.4 g/dL (ref 30.0–36.0)
MCV: 88 fL (ref 78.0–100.0)
Platelets: 235 10*3/uL (ref 150–400)
RDW: 13.1 % (ref 11.5–15.5)
WBC: 8.2 10*3/uL (ref 4.0–10.5)

## 2013-09-04 LAB — POCT URINALYSIS DIP (DEVICE)
Hgb urine dipstick: NEGATIVE
Nitrite: NEGATIVE
Urobilinogen, UA: 0.2 mg/dL (ref 0.0–1.0)
pH: 6.5 (ref 5.0–8.0)

## 2013-09-04 NOTE — Progress Notes (Signed)
+  FM no lof, no vb, no ctx  Dominique Stewart is a 22 y.o. G2P1001 at [redacted]w[redacted]d here for ROB visit.  Discussed with Patient:  -Plans to bottle feed.  All questions answered. -Continue prenatal vitamins. -Reviewed fetal kick counts (Pt to perform daily at a time when the baby is active, lie laterally with both hands on belly in quiet room and count all movements (hiccups, shoulder rolls, obvious kicks, etc); pt is to report to clinic or [facility]U for less than 10 movements felt in a one hour time period-pt told as soon as she counts 10 movements the count is complete.)  - Routine precautions discussed (depression, infection s/s).   Patient provided with all pertinent phone numbers for emergencies. - RTC for any VB, regular, painful cramps/ctxs occurring at a rate of >2/10 min, fever (100.5 or higher), n/v/d, any pain that is unresolving or worsening, LOF, decreased fetal movement, CP, SOB, edema  Problems: Patient Active Problem List   Diagnosis Date Noted  . Insufficient prenatal care 06/21/2013    To Do: 1. Glucose tolerance test ordered.  Patient will draw in clinic.  Will f/u test and amend plan based on results. 2. CBC and antibody screen ordered.   [ ]  Vaccines: Flu: recd  Tdap: recd [ ]  BCM: mirena  Edu: [x ] PTL precautions; [ ]  BF class; [ ]  childbirth class; [ ]   BF counseling;

## 2013-09-04 NOTE — Progress Notes (Signed)
Pulse- 91 Patient reports pelvic pressure; also reports painful intercourse, states this occurs outside pregnancy as well

## 2013-09-04 NOTE — Patient Instructions (Signed)
Third Trimester of Pregnancy  The third trimester is from week 29 through week 42, months 7 through 9. The third trimester is a time when the fetus is growing rapidly. At the end of the ninth month, the fetus is about 20 inches in length and weighs 6 10 pounds.   BODY CHANGES  Your body goes through many changes during pregnancy. The changes vary from woman to woman.    Your weight will continue to increase. You can expect to gain 25 35 pounds (11 16 kg) by the end of the pregnancy.   You may begin to get stretch marks on your hips, abdomen, and breasts.   You may urinate more often because the fetus is moving lower into your pelvis and pressing on your bladder.   You may develop or continue to have heartburn as a result of your pregnancy.   You may develop constipation because certain hormones are causing the muscles that push waste through your intestines to slow down.   You may develop hemorrhoids or swollen, bulging veins (varicose veins).   You may have pelvic pain because of the weight gain and pregnancy hormones relaxing your joints between the bones in your pelvis. Back aches may result from over exertion of the muscles supporting your posture.   Your breasts will continue to grow and be tender. A yellow discharge may leak from your breasts called colostrum.   Your belly button may stick out.   You may feel short of breath because of your expanding uterus.   You may notice the fetus "dropping," or moving lower in your abdomen.   You may have a bloody mucus discharge. This usually occurs a few days to a week before labor begins.   Your cervix becomes thin and soft (effaced) near your due date.  WHAT TO EXPECT AT YOUR PRENATAL EXAMS   You will have prenatal exams every 2 weeks until week 36. Then, you will have weekly prenatal exams. During a routine prenatal visit:   You will be weighed to make sure you and the fetus are growing normally.   Your blood pressure is taken.   Your abdomen will be  measured to track your baby's growth.   The fetal heartbeat will be listened to.   Any test results from the previous visit will be discussed.   You may have a cervical check near your due date to see if you have effaced.  At around 36 weeks, your caregiver will check your cervix. At the same time, your caregiver will also perform a test on the secretions of the vaginal tissue. This test is to determine if a type of bacteria, Group B streptococcus, is present. Your caregiver will explain this further.  Your caregiver may ask you:   What your birth plan is.   How you are feeling.   If you are feeling the baby move.   If you have had any abnormal symptoms, such as leaking fluid, bleeding, severe headaches, or abdominal cramping.   If you have any questions.  Other tests or screenings that may be performed during your third trimester include:   Blood tests that check for low iron levels (anemia).   Fetal testing to check the health, activity level, and growth of the fetus. Testing is done if you have certain medical conditions or if there are problems during the pregnancy.  FALSE LABOR  You may feel small, irregular contractions that eventually go away. These are called Braxton Hicks contractions, or   false labor. Contractions may last for hours, days, or even weeks before true labor sets in. If contractions come at regular intervals, intensify, or become painful, it is best to be seen by your caregiver.   SIGNS OF LABOR    Menstrual-like cramps.   Contractions that are 5 minutes apart or less.   Contractions that start on the top of the uterus and spread down to the lower abdomen and back.   A sense of increased pelvic pressure or back pain.   A watery or bloody mucus discharge that comes from the vagina.  If you have any of these signs before the 37th week of pregnancy, call your caregiver right away. You need to go to the hospital to get checked immediately.  HOME CARE INSTRUCTIONS    Avoid all  smoking, herbs, alcohol, and unprescribed drugs. These chemicals affect the formation and growth of the baby.   Follow your caregiver's instructions regarding medicine use. There are medicines that are either safe or unsafe to take during pregnancy.   Exercise only as directed by your caregiver. Experiencing uterine cramps is a good sign to stop exercising.   Continue to eat regular, healthy meals.   Wear a good support bra for breast tenderness.   Do not use hot tubs, steam rooms, or saunas.   Wear your seat belt at all times when driving.   Avoid raw meat, uncooked cheese, cat litter boxes, and soil used by cats. These carry germs that can cause birth defects in the baby.   Take your prenatal vitamins.   Try taking a stool softener (if your caregiver approves) if you develop constipation. Eat more high-fiber foods, such as fresh vegetables or fruit and whole grains. Drink plenty of fluids to keep your urine clear or pale yellow.   Take warm sitz baths to soothe any pain or discomfort caused by hemorrhoids. Use hemorrhoid cream if your caregiver approves.   If you develop varicose veins, wear support hose. Elevate your feet for 15 minutes, 3 4 times a day. Limit salt in your diet.   Avoid heavy lifting, wear low heal shoes, and practice good posture.   Rest a lot with your legs elevated if you have leg cramps or low back pain.   Visit your dentist if you have not gone during your pregnancy. Use a soft toothbrush to brush your teeth and be gentle when you floss.   A sexual relationship may be continued unless your caregiver directs you otherwise.   Do not travel far distances unless it is absolutely necessary and only with the approval of your caregiver.   Take prenatal classes to understand, practice, and ask questions about the labor and delivery.   Make a trial run to the hospital.   Pack your hospital bag.   Prepare the baby's nursery.   Continue to go to all your prenatal visits as directed  by your caregiver.  SEEK MEDICAL CARE IF:   You are unsure if you are in labor or if your water has broken.   You have dizziness.   You have mild pelvic cramps, pelvic pressure, or nagging pain in your abdominal area.   You have persistent nausea, vomiting, or diarrhea.   You have a bad smelling vaginal discharge.   You have pain with urination.  SEEK IMMEDIATE MEDICAL CARE IF:    You have a fever.   You are leaking fluid from your vagina.   You have spotting or bleeding from your vagina.     You have severe abdominal cramping or pain.   You have rapid weight loss or gain.   You have shortness of breath with chest pain.   You notice sudden or extreme swelling of your face, hands, ankles, feet, or legs.   You have not felt your baby move in over an hour.   You have severe headaches that do not go away with medicine.   You have vision changes.  Document Released: 09/07/2001 Document Revised: 05/16/2013 Document Reviewed: 11/14/2012  [employer] Patient Information 2014 [employer].

## 2013-09-18 ENCOUNTER — Ambulatory Visit (INDEPENDENT_AMBULATORY_CARE_PROVIDER_SITE_OTHER): Payer: Medicaid Other | Admitting: Advanced Practice Midwife

## 2013-09-18 VITALS — BP 110/72 | Temp 97.5°F | Wt 190.7 lb

## 2013-09-18 DIAGNOSIS — O0930 Supervision of pregnancy with insufficient antenatal care, unspecified trimester: Secondary | ICD-10-CM

## 2013-09-18 LAB — POCT URINALYSIS DIP (DEVICE)
Bilirubin Urine: NEGATIVE
Glucose, UA: NEGATIVE mg/dL
Hgb urine dipstick: NEGATIVE
Ketones, ur: NEGATIVE mg/dL
Leukocytes, UA: NEGATIVE
Nitrite: NEGATIVE
Urobilinogen, UA: 0.2 mg/dL (ref 0.0–1.0)

## 2013-09-18 NOTE — Progress Notes (Signed)
Doing well.  Good fetal movement, denies vaginal bleeding, LOF, regular contractions.  Starting to have URI symptoms, scratchy throat, sneezing.  Recommend increased PO fluids, discussed safe medications in pregnancy.

## 2013-09-18 NOTE — Patient Instructions (Signed)
Safe cold medicines:  Sudafed (pseudophedrine) Chloraseptic  Cough drops (all kinds) Mucinex (guafenesin)   Upper Respiratory Infection, Adult An upper respiratory infection (URI) is also known as the common cold. It is often caused by a type of germ (virus). Colds are easily spread (contagious). You can pass it to others by kissing, coughing, sneezing, or drinking out of the same glass. Usually, you get better in 1 or 2 weeks.  HOME CARE   Only take medicine as told by your doctor.  Use a warm mist humidifier or breathe in steam from a hot shower.  Drink enough water and fluids to keep your pee (urine) clear or pale yellow.  Get plenty of rest.  Return to work when your temperature is back to normal or as told by your doctor. You may use a face mask and wash your hands to stop your cold from spreading. GET HELP RIGHT AWAY IF:   After the first few days, you feel you are getting worse.  You have questions about your medicine.  You have chills, shortness of breath, or brown or red spit (mucus).  You have yellow or brown snot (nasal discharge) or pain in the face, especially when you bend forward.  You have a fever, puffy (swollen) neck, pain when you swallow, or white spots in the back of your throat.  You have a bad headache, ear pain, sinus pain, or chest pain.  You have a high-pitched whistling sound when you breathe in and out (wheezing).  You have a lasting cough or cough up blood.  You have sore muscles or a stiff neck. MAKE SURE YOU:   Understand these instructions.  Will watch your condition.  Will get help right away if you are not doing well or get worse. Document Released: 03/01/2008 Document Revised: 12/06/2011 Document Reviewed: 01/18/2011 [facility] Patient Information 2014 [place], [place].

## 2013-09-18 NOTE — Progress Notes (Signed)
P= 91 C/o of intermittent lower abdominal/pelvic pressure. Braxton hicks.

## 2013-09-21 ENCOUNTER — Other Ambulatory Visit: Payer: Self-pay | Admitting: Family Medicine

## 2013-09-21 ENCOUNTER — Ambulatory Visit (HOSPITAL_COMMUNITY)
Admission: RE | Admit: 2013-09-21 | Discharge: 2013-09-21 | Disposition: A | Discharge status: Home / Self Care | Payer: Medicaid Other | Source: Ambulatory Visit | Attending: Family Medicine | Admitting: Family Medicine

## 2013-09-21 DIAGNOSIS — O0933 Supervision of pregnancy with insufficient antenatal care, third trimester: Secondary | ICD-10-CM

## 2013-09-21 DIAGNOSIS — O358XX0 Maternal care for other (suspected) fetal abnormality and damage, not applicable or unspecified: Secondary | ICD-10-CM | POA: Insufficient documentation

## 2013-09-23 ENCOUNTER — Inpatient Hospital Stay (HOSPITAL_COMMUNITY)
Admission: AD | Admit: 2013-09-23 | Discharge: 2013-09-23 | Disposition: A | Discharge status: Home / Self Care | Payer: Medicaid Other | Source: Ambulatory Visit | Attending: Obstetrics and Gynecology | Admitting: Obstetrics and Gynecology

## 2013-09-23 ENCOUNTER — Encounter (HOSPITAL_COMMUNITY): Payer: Self-pay | Admitting: *Deleted

## 2013-09-23 DIAGNOSIS — N949 Unspecified condition associated with female genital organs and menstrual cycle: Secondary | ICD-10-CM | POA: Insufficient documentation

## 2013-09-23 DIAGNOSIS — B373 Candidiasis of vulva and vagina: Secondary | ICD-10-CM | POA: Insufficient documentation

## 2013-09-23 DIAGNOSIS — Z87891 Personal history of nicotine dependence: Secondary | ICD-10-CM | POA: Insufficient documentation

## 2013-09-23 DIAGNOSIS — B3731 Acute candidiasis of vulva and vagina: Secondary | ICD-10-CM | POA: Insufficient documentation

## 2013-09-23 DIAGNOSIS — O2390 Unspecified genitourinary tract infection in pregnancy, unspecified trimester: Secondary | ICD-10-CM | POA: Insufficient documentation

## 2013-09-23 DIAGNOSIS — B372 Candidiasis of skin and nail: Secondary | ICD-10-CM

## 2013-09-23 LAB — URINALYSIS, ROUTINE W REFLEX MICROSCOPIC
Nitrite: NEGATIVE
Protein, ur: NEGATIVE mg/dL
Specific Gravity, Urine: 1.025 (ref 1.005–1.030)
Urobilinogen, UA: 0.2 mg/dL (ref 0.0–1.0)

## 2013-09-23 LAB — WET PREP, GENITAL
Trich, Wet Prep: NONE SEEN
Yeast Wet Prep HPF POC: NONE SEEN

## 2013-09-23 MED ORDER — CLOTRIMAZOLE 1 % EX CREA: TOPICAL_CREAM | CUTANEOUS | Status: DC

## 2013-09-23 NOTE — MAU Note (Signed)
Patient presents with complaint of "pain in girl part" since 2100 last night.

## 2013-09-23 NOTE — MAU Provider Note (Signed)
  History     CSN: [id number]  Arrival date and time: 09/23/13 1028   First Provider Initiated Contact with Patient 09/23/13 1128      Chief Complaint  Patient presents with  . Vaginal Pain   HPI 22 yo G2P1001 at 33.4 weeks seen for sharp, constant vaginal pain that started 1 day ago.  Pain feels on labia.  No burning pain or pain with urination.  No palliating or provoking factors.  Good fetal activity.  Denies vaginal bleeding, vaginal discharge  OB History   Grav Para Term Preterm Abortions TAB SAB Ect Mult Living   2 1 1       1       Past Medical History  Diagnosis Date  . Allergy     Past Surgical History  Procedure Laterality Date  . No past surgeries      Family History  Problem Relation Age of Onset  . Cancer Paternal Grandmother   . Diabetes Paternal Grandmother     History  Substance Use Topics  . Smoking status: Former Games developer  . Smokeless tobacco: Never Used  . Alcohol Use: No    Allergies: No Known Allergies  Prescriptions prior to admission  Medication Sig Dispense Refill  . aluminum & magnesium hydroxide-simethicone (MYLANTA) 500-450-40 MG/5ML suspension Take 15 mLs by mouth daily as needed for indigestion.      . Prenatal Vit-Fe Fumarate-FA (PRENATAL MULTIVITAMIN) TABS tablet Take 1 tablet by mouth at bedtime.         Review of Systems  Constitutional: Negative for fever and chills.  Gastrointestinal: Negative for nausea, vomiting, abdominal pain, diarrhea and constipation.  Genitourinary: Negative for dysuria, urgency, frequency and hematuria.  Musculoskeletal: Negative for myalgias.   Physical Exam   Blood pressure 130/69, pulse 94, temperature 97.8 F (36.6 C), temperature source Oral, resp. rate 20, height 5' 2.5" (1.588 m), weight 87.658 kg (193 lb 4 oz), last menstrual period 01/31/2013.  Physical Exam  Constitutional: She appears well-developed and well-nourished.  Cardiovascular: Normal rate.   Respiratory: Effort normal.  GI:  Soft. She exhibits no distension and no mass. There is no tenderness. There is no rebound and no guarding.  Genitourinary: Vagina normal. No vaginal discharge found.  Mild erythema to labia minora.  Neurological: She is alert.  Skin: Skin is warm and dry.  Psychiatric: She has a normal mood and affect. Her behavior is normal. Judgment and thought content normal.   Dilation: 1 Effacement (%): Thick Cervical Position: Anterior Station: Ballotable Exam by:: Dellie Burns, RN BSN  Wet mount - negative UA - negative  MAU Course  Procedures NST: category 1 with accels and no decel.  MDM No evidence of contractions or preterm labor.  No bacterial infection.  Likely minor labial yeast infection  Assessment and Plan  1. Yeast infection Topical clotrimazole given to apply two times daily.  F/u in clinic at next appt.  STINSON, JACOB JEHIEL 09/23/2013, 11:34 AM

## 2013-09-26 ENCOUNTER — Telehealth: Payer: Self-pay | Admitting: *Deleted

## 2013-09-26 ENCOUNTER — Encounter: Payer: Self-pay | Admitting: Family Medicine

## 2013-09-26 DIAGNOSIS — O403XX0 Polyhydramnios, third trimester, not applicable or unspecified: Secondary | ICD-10-CM | POA: Insufficient documentation

## 2013-09-26 NOTE — Telephone Encounter (Signed)
Called pt and informed of note from Dr. Ike Bene.  Scheduled pt for NST/AFI on Monday 10/01/13 @ 1400.  Thursday 10/04/13 @ 1400 for NST/OBF.  Explained to pt the need for additional testing and new flu restriction policy for visitors at [facility].  Pt verbalizes understanding and confirms appt.

## 2013-09-26 NOTE — Telephone Encounter (Signed)
Message copied by Dorothyann Peng on Wed Sep 26, 2013  8:06 AM ------      Message from: Jolyn Lent R      Created: Tue Sep 25, 2013  8:20 AM       Pt with polyhydramnios on [place], in need of biweekly NST and weekly AFI. Please coordinate. Thank you.            Cheers,      MRO ------

## 2013-10-01 ENCOUNTER — Ambulatory Visit (INDEPENDENT_AMBULATORY_CARE_PROVIDER_SITE_OTHER): Payer: Medicaid Other | Admitting: *Deleted

## 2013-10-01 DIAGNOSIS — O403XX1 Polyhydramnios, third trimester, fetus 1: Secondary | ICD-10-CM

## 2013-10-01 DIAGNOSIS — O409XX0 Polyhydramnios, unspecified trimester, not applicable or unspecified: Secondary | ICD-10-CM

## 2013-10-01 LAB — US OB FOLLOW UP

## 2013-10-03 NOTE — Progress Notes (Signed)
NST 10-01-12 reviewed and reactive 

## 2013-10-04 ENCOUNTER — Other Ambulatory Visit: Payer: Medicaid Other

## 2013-10-04 ENCOUNTER — Encounter: Payer: Medicaid Other | Admitting: Advanced Practice Midwife

## 2013-10-08 ENCOUNTER — Other Ambulatory Visit: Payer: Medicaid Other

## 2013-10-15 ENCOUNTER — Ambulatory Visit (INDEPENDENT_AMBULATORY_CARE_PROVIDER_SITE_OTHER): Payer: Medicaid Other | Admitting: *Deleted

## 2013-10-15 VITALS — BP 101/55

## 2013-10-15 DIAGNOSIS — O403XX0 Polyhydramnios, third trimester, not applicable or unspecified: Secondary | ICD-10-CM

## 2013-10-15 DIAGNOSIS — O409XX0 Polyhydramnios, unspecified trimester, not applicable or unspecified: Secondary | ICD-10-CM

## 2013-10-15 LAB — POCT URINALYSIS DIP (DEVICE)
BILIRUBIN URINE: NEGATIVE
Glucose, UA: NEGATIVE mg/dL
HGB URINE DIPSTICK: NEGATIVE
KETONES UR: NEGATIVE mg/dL
LEUKOCYTES UA: NEGATIVE
Nitrite: NEGATIVE
Protein, ur: NEGATIVE mg/dL
Specific Gravity, Urine: 1.02 (ref 1.005–1.030)
Urobilinogen, UA: 1 mg/dL (ref 0.0–1.0)
pH: 7 (ref 5.0–8.0)

## 2013-10-15 LAB — US OB FOLLOW UP

## 2013-10-15 NOTE — Progress Notes (Signed)
NST reviewed and reactive.  

## 2013-10-15 NOTE — Progress Notes (Signed)
P = 88     Pt has missed several appts due to scheduling conflicts.

## 2013-10-18 ENCOUNTER — Other Ambulatory Visit: Payer: Medicaid Other

## 2013-10-22 ENCOUNTER — Ambulatory Visit (INDEPENDENT_AMBULATORY_CARE_PROVIDER_SITE_OTHER): Payer: Medicaid Other | Admitting: *Deleted

## 2013-10-22 DIAGNOSIS — O409XX0 Polyhydramnios, unspecified trimester, not applicable or unspecified: Secondary | ICD-10-CM

## 2013-10-22 DIAGNOSIS — O403XX0 Polyhydramnios, third trimester, not applicable or unspecified: Secondary | ICD-10-CM

## 2013-10-22 LAB — US OB FOLLOW UP

## 2013-10-24 NOTE — Progress Notes (Signed)
NST 10/22/13 reviewed and reactive

## 2013-10-25 ENCOUNTER — Other Ambulatory Visit: Payer: Medicaid Other

## 2013-10-29 ENCOUNTER — Ambulatory Visit (INDEPENDENT_AMBULATORY_CARE_PROVIDER_SITE_OTHER): Payer: Medicaid Other | Admitting: *Deleted

## 2013-10-29 ENCOUNTER — Ambulatory Visit (HOSPITAL_COMMUNITY)
Admission: RE | Admit: 2013-10-29 | Discharge: 2013-10-29 | Disposition: A | Discharge status: Home / Self Care | Payer: Medicaid Other | Source: Ambulatory Visit | Attending: Obstetrics & Gynecology | Admitting: Obstetrics & Gynecology

## 2013-10-29 VITALS — BP 107/66

## 2013-10-29 DIAGNOSIS — O0930 Supervision of pregnancy with insufficient antenatal care, unspecified trimester: Secondary | ICD-10-CM | POA: Insufficient documentation

## 2013-10-29 DIAGNOSIS — O409XX0 Polyhydramnios, unspecified trimester, not applicable or unspecified: Secondary | ICD-10-CM | POA: Insufficient documentation

## 2013-10-29 DIAGNOSIS — O3660X0 Maternal care for excessive fetal growth, unspecified trimester, not applicable or unspecified: Secondary | ICD-10-CM | POA: Insufficient documentation

## 2013-10-29 DIAGNOSIS — O403XX0 Polyhydramnios, third trimester, not applicable or unspecified: Secondary | ICD-10-CM

## 2013-10-29 DIAGNOSIS — O358XX0 Maternal care for other (suspected) fetal abnormality and damage, not applicable or unspecified: Secondary | ICD-10-CM | POA: Insufficient documentation

## 2013-10-29 LAB — GLUCOSE, CAPILLARY: Glucose-Capillary: 78 mg/dL (ref 70–99)

## 2013-10-29 LAB — POCT URINALYSIS DIP (DEVICE)
Bilirubin Urine: NEGATIVE
Glucose, UA: NEGATIVE mg/dL
Hgb urine dipstick: NEGATIVE
KETONES UR: NEGATIVE mg/dL
Leukocytes, UA: NEGATIVE
Nitrite: NEGATIVE
PH: 7 (ref 5.0–8.0)
Protein, ur: NEGATIVE mg/dL
SPECIFIC GRAVITY, URINE: 1.025 (ref 1.005–1.030)
UROBILINOGEN UA: 0.2 mg/dL (ref 0.0–1.0)

## 2013-10-29 NOTE — Progress Notes (Signed)
NST reviewed and reactive.  Kellan Boehlke L. Harraway-Smith, M.D., FACOG    

## 2013-10-29 NOTE — Progress Notes (Signed)
P = 88    Pt has missed many appts for prenatal visit and NST since 12/23 due to child care issues. She was last seen for NST/AFI on 1/26. AFI was normal. US for growth done today- EFW = 3629gm and AFI 23.48 - wnl.  Pt was supposed to have fasting CBG @ last scheduled visit on 1/29 which she missed. Random CBG =78. Pt advised the importance of prenatal visits and she states she will keep appt on 2/5.

## 2013-11-01 ENCOUNTER — Ambulatory Visit (INDEPENDENT_AMBULATORY_CARE_PROVIDER_SITE_OTHER): Payer: Medicaid Other | Admitting: Obstetrics and Gynecology

## 2013-11-01 ENCOUNTER — Encounter: Payer: Self-pay | Admitting: Obstetrics and Gynecology

## 2013-11-01 VITALS — BP 112/71 | Temp 96.9°F | Wt 197.8 lb

## 2013-11-01 DIAGNOSIS — O403XX0 Polyhydramnios, third trimester, not applicable or unspecified: Secondary | ICD-10-CM

## 2013-11-01 DIAGNOSIS — O0930 Supervision of pregnancy with insufficient antenatal care, unspecified trimester: Secondary | ICD-10-CM

## 2013-11-01 DIAGNOSIS — O409XX0 Polyhydramnios, unspecified trimester, not applicable or unspecified: Secondary | ICD-10-CM

## 2013-11-01 LAB — OB RESULTS CONSOLE GC/CHLAMYDIA
Chlamydia: NEGATIVE
GC PROBE AMP, GENITAL: NEGATIVE

## 2013-11-01 LAB — OB RESULTS CONSOLE GBS: STREP GROUP B AG: POSITIVE

## 2013-11-01 LAB — POCT URINALYSIS DIP (DEVICE)
BILIRUBIN URINE: NEGATIVE
Glucose, UA: NEGATIVE mg/dL
HGB URINE DIPSTICK: NEGATIVE
KETONES UR: NEGATIVE mg/dL
Leukocytes, UA: NEGATIVE
Nitrite: NEGATIVE
PROTEIN: NEGATIVE mg/dL
SPECIFIC GRAVITY, URINE: 1.02 (ref 1.005–1.030)
Urobilinogen, UA: 0.2 mg/dL (ref 0.0–1.0)
pH: 6.5 (ref 5.0–8.0)

## 2013-11-01 NOTE — Progress Notes (Signed)
Pulse- 86 Patient reports pelvic pain and pressure and occasional nonpainful contractions

## 2013-11-01 NOTE — Progress Notes (Signed)
Patient is doing well without complaints. FM/labor precautions reviewed. Cultures collected. Membranes stripped.

## 2013-11-02 LAB — GC/CHLAMYDIA PROBE AMP
CT Probe RNA: NEGATIVE
GC PROBE AMP APTIMA: NEGATIVE

## 2013-11-04 LAB — CULTURE, BETA STREP (GROUP B ONLY)

## 2013-11-05 ENCOUNTER — Encounter: Payer: Self-pay | Admitting: Obstetrics and Gynecology

## 2013-11-05 ENCOUNTER — Telehealth: Payer: Self-pay | Admitting: General Practice

## 2013-11-05 DIAGNOSIS — O99820 Streptococcus B carrier state complicating pregnancy: Secondary | ICD-10-CM | POA: Insufficient documentation

## 2013-11-05 NOTE — Telephone Encounter (Signed)
Patient called and left message stating she lost her mucous plug and she's 39.2 weeks and would like a call back about what to do. Per chart review patient had membranes stripped at office visit on 2/5. Called patient back and stated I was returning her phone call and at this point we wouldn't do anything for losing her mucous plug but if she were to start having contractions every 2-3 minutes or her water were to break then she should come into MAU for evaluation. Patient verbalized understanding and had no further questions

## 2013-11-08 ENCOUNTER — Encounter: Payer: Medicaid Other | Admitting: Family Medicine

## 2013-11-09 ENCOUNTER — Inpatient Hospital Stay (HOSPITAL_COMMUNITY)
Admission: AD | Admit: 2013-11-09 | Discharge: 2013-11-12 | DRG: 765 | Disposition: A | Discharge status: Home / Self Care | Payer: Medicaid Other | Source: Ambulatory Visit | Attending: Obstetrics & Gynecology | Admitting: Obstetrics & Gynecology

## 2013-11-09 DIAGNOSIS — Z22330 Carrier of Group B streptococcus: Secondary | ICD-10-CM

## 2013-11-09 DIAGNOSIS — O403XX0 Polyhydramnios, third trimester, not applicable or unspecified: Secondary | ICD-10-CM

## 2013-11-09 DIAGNOSIS — O0930 Supervision of pregnancy with insufficient antenatal care, unspecified trimester: Secondary | ICD-10-CM

## 2013-11-09 DIAGNOSIS — O9989 Other specified diseases and conditions complicating pregnancy, childbirth and the puerperium: Secondary | ICD-10-CM

## 2013-11-09 DIAGNOSIS — Z98891 History of uterine scar from previous surgery: Secondary | ICD-10-CM

## 2013-11-09 DIAGNOSIS — Z6834 Body mass index (BMI) 34.0-34.9, adult: Secondary | ICD-10-CM

## 2013-11-09 DIAGNOSIS — E669 Obesity, unspecified: Secondary | ICD-10-CM | POA: Diagnosis present

## 2013-11-09 DIAGNOSIS — O99214 Obesity complicating childbirth: Secondary | ICD-10-CM

## 2013-11-09 DIAGNOSIS — O409XX0 Polyhydramnios, unspecified trimester, not applicable or unspecified: Secondary | ICD-10-CM | POA: Diagnosis present

## 2013-11-09 DIAGNOSIS — O99820 Streptococcus B carrier state complicating pregnancy: Secondary | ICD-10-CM

## 2013-11-09 DIAGNOSIS — IMO0001 Reserved for inherently not codable concepts without codable children: Secondary | ICD-10-CM

## 2013-11-09 DIAGNOSIS — O99892 Other specified diseases and conditions complicating childbirth: Secondary | ICD-10-CM | POA: Diagnosis present

## 2013-11-10 ENCOUNTER — Encounter (HOSPITAL_COMMUNITY): Payer: Medicaid Other | Admitting: Anesthesiology

## 2013-11-10 ENCOUNTER — Encounter (HOSPITAL_COMMUNITY): Payer: Self-pay | Admitting: *Deleted

## 2013-11-10 ENCOUNTER — Encounter (HOSPITAL_COMMUNITY)
Admission: AD | Disposition: A | Discharge status: Home / Self Care | Payer: Self-pay | Source: Ambulatory Visit | Attending: Obstetrics & Gynecology

## 2013-11-10 ENCOUNTER — Inpatient Hospital Stay (HOSPITAL_COMMUNITY): Payer: Medicaid Other | Admitting: Anesthesiology

## 2013-11-10 DIAGNOSIS — O99892 Other specified diseases and conditions complicating childbirth: Secondary | ICD-10-CM

## 2013-11-10 DIAGNOSIS — E669 Obesity, unspecified: Secondary | ICD-10-CM

## 2013-11-10 DIAGNOSIS — O9989 Other specified diseases and conditions complicating pregnancy, childbirth and the puerperium: Secondary | ICD-10-CM

## 2013-11-10 DIAGNOSIS — O409XX0 Polyhydramnios, unspecified trimester, not applicable or unspecified: Secondary | ICD-10-CM

## 2013-11-10 DIAGNOSIS — Z98891 History of uterine scar from previous surgery: Secondary | ICD-10-CM

## 2013-11-10 DIAGNOSIS — IMO0001 Reserved for inherently not codable concepts without codable children: Secondary | ICD-10-CM

## 2013-11-10 LAB — CBC
HEMATOCRIT: 34.4 % — AB (ref 36.0–46.0)
Hemoglobin: 11.6 g/dL — ABNORMAL LOW (ref 12.0–15.0)
MCH: 28.3 pg (ref 26.0–34.0)
MCHC: 33.7 g/dL (ref 30.0–36.0)
MCV: 83.9 fL (ref 78.0–100.0)
Platelets: 180 10*3/uL (ref 150–400)
RBC: 4.1 MIL/uL (ref 3.87–5.11)
RDW: 14.4 % (ref 11.5–15.5)
WBC: 9.6 10*3/uL (ref 4.0–10.5)

## 2013-11-10 LAB — RPR: RPR Ser Ql: NONREACTIVE

## 2013-11-10 LAB — TYPE AND SCREEN
ABO/RH(D): O POS
Antibody Screen: NEGATIVE

## 2013-11-10 LAB — ABO/RH: ABO/RH(D): O POS

## 2013-11-10 SURGERY — Surgical Case
Anesthesia: Epidural | Site: Abdomen

## 2013-11-10 MED ORDER — SODIUM BICARBONATE 8.4 % IV SOLN
INTRAVENOUS | Status: AC
  Filled 2013-11-10: qty 50

## 2013-11-10 MED ORDER — WITCH HAZEL-GLYCERIN EX PADS: 1.0000 "application " | MEDICATED_PAD | CUTANEOUS | Status: DC | PRN

## 2013-11-10 MED ORDER — DIPHENHYDRAMINE HCL 50 MG/ML IJ SOLN: 12.5000 mg | INTRAMUSCULAR | Status: DC | PRN

## 2013-11-10 MED ORDER — SODIUM CHLORIDE 0.9 % IV SOLN
2.0000 g | Freq: Once | INTRAVENOUS | Status: AC
  Administered 2013-11-10: 2 g via INTRAVENOUS
  Filled 2013-11-10: qty 2000

## 2013-11-10 MED ORDER — TETANUS-DIPHTH-ACELL PERTUSSIS 5-2.5-18.5 LF-MCG/0.5 IM SUSP: 0.5000 mL | Freq: Once | INTRAMUSCULAR | Status: DC

## 2013-11-10 MED ORDER — OXYCODONE-ACETAMINOPHEN 5-325 MG PO TABS
1.0000 | ORAL_TABLET | ORAL | Status: DC | PRN
  Administered 2013-11-11: 2 via ORAL
  Administered 2013-11-11: 1 via ORAL
  Administered 2013-11-11 – 2013-11-12 (×2): 2 via ORAL
  Administered 2013-11-12: 1 via ORAL
  Filled 2013-11-10 (×4): qty 2
  Filled 2013-11-10: qty 1

## 2013-11-10 MED ORDER — SIMETHICONE 80 MG PO CHEW
80.0000 mg | CHEWABLE_TABLET | Freq: Three times a day (TID) | ORAL | Status: DC
  Administered 2013-11-11 – 2013-11-12 (×5): 80 mg via ORAL
  Filled 2013-11-10 (×5): qty 1

## 2013-11-10 MED ORDER — FENTANYL 2.5 MCG/ML BUPIVACAINE 1/10 % EPIDURAL INFUSION (WH - ANES)
14.0000 mL/h | INTRAMUSCULAR | Status: DC | PRN
  Administered 2013-11-10 (×2): 14 mL/h via EPIDURAL
  Filled 2013-11-10 (×2): qty 125

## 2013-11-10 MED ORDER — SCOPOLAMINE 1 MG/3DAYS TD PT72
1.0000 | MEDICATED_PATCH | Freq: Once | TRANSDERMAL | Status: DC
  Administered 2013-11-10: 1.5 mg via TRANSDERMAL

## 2013-11-10 MED ORDER — IBUPROFEN 600 MG PO TABS
600.0000 mg | ORAL_TABLET | Freq: Four times a day (QID) | ORAL | Status: DC
  Administered 2013-11-10 – 2013-11-12 (×7): 600 mg via ORAL
  Filled 2013-11-10 (×7): qty 1

## 2013-11-10 MED ORDER — ACETAMINOPHEN 325 MG PO TABS: 650.0000 mg | ORAL_TABLET | ORAL | Status: DC | PRN

## 2013-11-10 MED ORDER — ONDANSETRON HCL 4 MG/2ML IJ SOLN: 4.0000 mg | Freq: Four times a day (QID) | INTRAMUSCULAR | Status: DC | PRN

## 2013-11-10 MED ORDER — ONDANSETRON HCL 4 MG/2ML IJ SOLN
INTRAMUSCULAR | Status: AC
  Filled 2013-11-10: qty 2

## 2013-11-10 MED ORDER — BUPIVACAINE HCL (PF) 0.5 % IJ SOLN
INTRAMUSCULAR | Status: DC | PRN
  Administered 2013-11-10: 30 mL

## 2013-11-10 MED ORDER — DIPHENHYDRAMINE HCL 50 MG/ML IJ SOLN: 25.0000 mg | INTRAMUSCULAR | Status: DC | PRN

## 2013-11-10 MED ORDER — PHENYLEPHRINE 40 MCG/ML (10ML) SYRINGE FOR IV PUSH (FOR BLOOD PRESSURE SUPPORT)
80.0000 ug | PREFILLED_SYRINGE | INTRAVENOUS | Status: DC | PRN
  Filled 2013-11-10: qty 10

## 2013-11-10 MED ORDER — MENTHOL 3 MG MT LOZG: 1.0000 | LOZENGE | OROMUCOSAL | Status: DC | PRN

## 2013-11-10 MED ORDER — MEPERIDINE HCL 25 MG/ML IJ SOLN: 6.2500 mg | INTRAMUSCULAR | Status: DC | PRN

## 2013-11-10 MED ORDER — LACTATED RINGERS IV SOLN: 500.0000 mL | INTRAVENOUS | Status: DC | PRN

## 2013-11-10 MED ORDER — HYDROMORPHONE HCL PF 1 MG/ML IJ SOLN: 0.2500 mg | INTRAMUSCULAR | Status: DC | PRN

## 2013-11-10 MED ORDER — LANOLIN HYDROUS EX OINT
1.0000 | TOPICAL_OINTMENT | CUTANEOUS | Status: DC | PRN
Start: 2013-11-10 — End: 2013-11-12

## 2013-11-10 MED ORDER — LACTATED RINGERS IV SOLN: 500.0000 mL | Freq: Once | INTRAVENOUS | Status: DC

## 2013-11-10 MED ORDER — MEASLES, MUMPS & RUBELLA VAC ~~LOC~~ INJ
0.5000 mL | INJECTION | Freq: Once | SUBCUTANEOUS | Status: DC
  Filled 2013-11-10: qty 0.5

## 2013-11-10 MED ORDER — DIPHENHYDRAMINE HCL 25 MG PO CAPS: 25.0000 mg | ORAL_CAPSULE | Freq: Four times a day (QID) | ORAL | Status: DC | PRN

## 2013-11-10 MED ORDER — ONDANSETRON HCL 4 MG/2ML IJ SOLN
INTRAMUSCULAR | Status: DC | PRN
  Administered 2013-11-10: 4 mg via INTRAVENOUS

## 2013-11-10 MED ORDER — SENNOSIDES-DOCUSATE SODIUM 8.6-50 MG PO TABS
2.0000 | ORAL_TABLET | ORAL | Status: DC
  Administered 2013-11-10 – 2013-11-12 (×2): 2 via ORAL
  Filled 2013-11-10 (×2): qty 2

## 2013-11-10 MED ORDER — DIPHENHYDRAMINE HCL 25 MG PO CAPS: 25.0000 mg | ORAL_CAPSULE | ORAL | Status: DC | PRN

## 2013-11-10 MED ORDER — LIDOCAINE-EPINEPHRINE (PF) 2 %-1:200000 IJ SOLN
INTRAMUSCULAR | Status: AC
  Filled 2013-11-10: qty 20

## 2013-11-10 MED ORDER — CEFAZOLIN SODIUM-DEXTROSE 2-3 GM-% IV SOLR
INTRAVENOUS | Status: DC | PRN
  Administered 2013-11-10: 2 g via INTRAVENOUS

## 2013-11-10 MED ORDER — ONDANSETRON HCL 4 MG PO TABS
4.0000 mg | ORAL_TABLET | ORAL | Status: DC | PRN
Start: 2013-11-10 — End: 2013-11-12

## 2013-11-10 MED ORDER — NALOXONE HCL 0.4 MG/ML IJ SOLN: 0.4000 mg | INTRAMUSCULAR | Status: DC | PRN

## 2013-11-10 MED ORDER — KETOROLAC TROMETHAMINE 30 MG/ML IJ SOLN
30.0000 mg | Freq: Four times a day (QID) | INTRAMUSCULAR | Status: AC | PRN
  Administered 2013-11-10: 30 mg via INTRAVENOUS

## 2013-11-10 MED ORDER — OXYCODONE-ACETAMINOPHEN 5-325 MG PO TABS: 1.0000 | ORAL_TABLET | ORAL | Status: DC | PRN

## 2013-11-10 MED ORDER — DIBUCAINE 1 % RE OINT: 1.0000 "application " | TOPICAL_OINTMENT | RECTAL | Status: DC | PRN

## 2013-11-10 MED ORDER — MORPHINE SULFATE (PF) 0.5 MG/ML IJ SOLN
INTRAMUSCULAR | Status: DC | PRN
  Administered 2013-11-10: 4 mg via EPIDURAL
  Administered 2013-11-10: 1 mg via INTRAVENOUS

## 2013-11-10 MED ORDER — CITRIC ACID-SODIUM CITRATE 334-500 MG/5ML PO SOLN
30.0000 mL | ORAL | Status: DC | PRN
  Administered 2013-11-10: 30 mL via ORAL
  Filled 2013-11-10: qty 15

## 2013-11-10 MED ORDER — OXYTOCIN 10 UNIT/ML IJ SOLN
40.0000 [IU] | INTRAVENOUS | Status: DC | PRN
  Administered 2013-11-10: 40 [IU] via INTRAVENOUS

## 2013-11-10 MED ORDER — LACTATED RINGERS IV SOLN
INTRAVENOUS | Status: DC
  Administered 2013-11-10 (×3): via INTRAVENOUS

## 2013-11-10 MED ORDER — EPHEDRINE 5 MG/ML INJ: 10.0000 mg | INTRAVENOUS | Status: DC | PRN

## 2013-11-10 MED ORDER — PHENYLEPHRINE 40 MCG/ML (10ML) SYRINGE FOR IV PUSH (FOR BLOOD PRESSURE SUPPORT): 80.0000 ug | PREFILLED_SYRINGE | INTRAVENOUS | Status: DC | PRN

## 2013-11-10 MED ORDER — SCOPOLAMINE 1 MG/3DAYS TD PT72
MEDICATED_PATCH | TRANSDERMAL | Status: AC
  Administered 2013-11-10: 1.5 mg via TRANSDERMAL
  Filled 2013-11-10: qty 1

## 2013-11-10 MED ORDER — MAGNESIUM HYDROXIDE 400 MG/5ML PO SUSP: 30.0000 mL | ORAL | Status: DC | PRN

## 2013-11-10 MED ORDER — OXYTOCIN 40 UNITS IN LACTATED RINGERS INFUSION - SIMPLE MED: 62.5000 mL/h | INTRAVENOUS | Status: AC

## 2013-11-10 MED ORDER — PROMETHAZINE HCL 25 MG/ML IJ SOLN: 6.2500 mg | INTRAMUSCULAR | Status: DC | PRN

## 2013-11-10 MED ORDER — ONDANSETRON HCL 4 MG/2ML IJ SOLN: 4.0000 mg | Freq: Three times a day (TID) | INTRAMUSCULAR | Status: DC | PRN

## 2013-11-10 MED ORDER — MORPHINE SULFATE 0.5 MG/ML IJ SOLN
INTRAMUSCULAR | Status: AC
  Filled 2013-11-10: qty 10

## 2013-11-10 MED ORDER — SODIUM BICARBONATE 8.4 % IV SOLN
INTRAVENOUS | Status: DC | PRN
  Administered 2013-11-10: 3 mL via EPIDURAL
  Administered 2013-11-10: 7 mL via EPIDURAL
  Administered 2013-11-10: 2 mL via EPIDURAL

## 2013-11-10 MED ORDER — IBUPROFEN 600 MG PO TABS: 600.0000 mg | ORAL_TABLET | Freq: Four times a day (QID) | ORAL | Status: DC | PRN

## 2013-11-10 MED ORDER — ONDANSETRON HCL 4 MG/2ML IJ SOLN
4.0000 mg | INTRAMUSCULAR | Status: DC | PRN
  Administered 2013-11-10: 4 mg via INTRAVENOUS
  Filled 2013-11-10: qty 2

## 2013-11-10 MED ORDER — METOCLOPRAMIDE HCL 5 MG/ML IJ SOLN: 10.0000 mg | Freq: Three times a day (TID) | INTRAMUSCULAR | Status: DC | PRN

## 2013-11-10 MED ORDER — OXYTOCIN BOLUS FROM INFUSION: 500.0000 mL | INTRAVENOUS | Status: DC

## 2013-11-10 MED ORDER — ZOLPIDEM TARTRATE 5 MG PO TABS: 5.0000 mg | ORAL_TABLET | Freq: Every evening | ORAL | Status: DC | PRN

## 2013-11-10 MED ORDER — NALOXONE HCL 1 MG/ML IJ SOLN
1.0000 ug/kg/h | INTRAMUSCULAR | Status: DC | PRN
  Filled 2013-11-10: qty 2

## 2013-11-10 MED ORDER — NALBUPHINE HCL 10 MG/ML IJ SOLN: 5.0000 mg | INTRAMUSCULAR | Status: DC | PRN

## 2013-11-10 MED ORDER — PRENATAL MULTIVITAMIN CH
1.0000 | ORAL_TABLET | Freq: Every day | ORAL | Status: DC
  Administered 2013-11-11 – 2013-11-12 (×2): 1 via ORAL
  Filled 2013-11-10 (×2): qty 1

## 2013-11-10 MED ORDER — OXYTOCIN 40 UNITS IN LACTATED RINGERS INFUSION - SIMPLE MED: 62.5000 mL/h | INTRAVENOUS | Status: DC

## 2013-11-10 MED ORDER — OXYTOCIN 10 UNIT/ML IJ SOLN
INTRAMUSCULAR | Status: AC
  Filled 2013-11-10: qty 4

## 2013-11-10 MED ORDER — KETOROLAC TROMETHAMINE 30 MG/ML IJ SOLN
INTRAMUSCULAR | Status: AC
  Filled 2013-11-10: qty 1

## 2013-11-10 MED ORDER — KETOROLAC TROMETHAMINE 30 MG/ML IJ SOLN: 15.0000 mg | Freq: Once | INTRAMUSCULAR | Status: DC | PRN

## 2013-11-10 MED ORDER — SIMETHICONE 80 MG PO CHEW
80.0000 mg | CHEWABLE_TABLET | ORAL | Status: DC | PRN
  Administered 2013-11-12: 80 mg via ORAL

## 2013-11-10 MED ORDER — LIDOCAINE HCL (PF) 1 % IJ SOLN: 30.0000 mL | INTRAMUSCULAR | Status: DC | PRN

## 2013-11-10 MED ORDER — LACTATED RINGERS IV SOLN
INTRAVENOUS | Status: DC
  Administered 2013-11-11 (×2): via INTRAVENOUS

## 2013-11-10 MED ORDER — BUPIVACAINE HCL (PF) 0.5 % IJ SOLN
INTRAMUSCULAR | Status: AC
  Filled 2013-11-10: qty 30

## 2013-11-10 MED ORDER — BUPIVACAINE HCL (PF) 0.25 % IJ SOLN
INTRAMUSCULAR | Status: AC
  Filled 2013-11-10: qty 30

## 2013-11-10 MED ORDER — SIMETHICONE 80 MG PO CHEW
80.0000 mg | CHEWABLE_TABLET | ORAL | Status: DC
  Administered 2013-11-10 – 2013-11-12 (×2): 80 mg via ORAL
  Filled 2013-11-10 (×2): qty 1

## 2013-11-10 MED ORDER — EPHEDRINE 5 MG/ML INJ
10.0000 mg | INTRAVENOUS | Status: DC | PRN
  Filled 2013-11-10: qty 4

## 2013-11-10 MED ORDER — LIDOCAINE HCL (PF) 1 % IJ SOLN
INTRAMUSCULAR | Status: DC | PRN
  Administered 2013-11-10 (×4): 4 mL

## 2013-11-10 MED ORDER — DIPHENHYDRAMINE HCL 50 MG/ML IJ SOLN
12.5000 mg | INTRAMUSCULAR | Status: DC | PRN
Start: 2013-11-10 — End: 2013-11-12

## 2013-11-10 MED ORDER — NALBUPHINE HCL 10 MG/ML IJ SOLN
5.0000 mg | INTRAMUSCULAR | Status: DC | PRN
  Administered 2013-11-10 – 2013-11-11 (×2): 10 mg via INTRAVENOUS
  Filled 2013-11-10 (×2): qty 1

## 2013-11-10 MED ORDER — KETOROLAC TROMETHAMINE 30 MG/ML IJ SOLN: 30.0000 mg | Freq: Four times a day (QID) | INTRAMUSCULAR | Status: AC | PRN

## 2013-11-10 MED ORDER — SODIUM CHLORIDE 0.9 % IJ SOLN: 3.0000 mL | INTRAMUSCULAR | Status: DC | PRN

## 2013-11-10 SURGICAL SUPPLY — 34 items
BARRIER ADHS 3X4 INTERCEED (GAUZE/BANDAGES/DRESSINGS) IMPLANT
CLAMP CORD UMBIL (MISCELLANEOUS) IMPLANT
CLOTH BEACON ORANGE TIMEOUT ST (SAFETY) ×3 IMPLANT
CONTAINER PREFILL 10% NBF 15ML (MISCELLANEOUS) IMPLANT
DRAPE LG THREE QUARTER DISP (DRAPES) IMPLANT
DRSG OPSITE POSTOP 4X10 (GAUZE/BANDAGES/DRESSINGS) ×3 IMPLANT
DURAPREP 26ML APPLICATOR (WOUND CARE) ×3 IMPLANT
ELECT REM PT RETURN 9FT ADLT (ELECTROSURGICAL) ×3
ELECTRODE REM PT RTRN 9FT ADLT (ELECTROSURGICAL) ×1 IMPLANT
EXTRACTOR VACUUM KIWI (MISCELLANEOUS) IMPLANT
GLOVE BIO SURGEON STRL SZ 6.5 (GLOVE) ×2 IMPLANT
GLOVE BIO SURGEONS STRL SZ 6.5 (GLOVE) ×1
GOWN STRL REUS W/TWL LRG LVL3 (GOWN DISPOSABLE) ×6 IMPLANT
KIT ABG SYR 3ML LUER SLIP (SYRINGE) IMPLANT
NEEDLE HYPO 25X5/8 SAFETYGLIDE (NEEDLE) IMPLANT
NEEDLE SPNL 18GX3.5 QUINCKE PK (NEEDLE) ×3 IMPLANT
NS IRRIG 1000ML POUR BTL (IV SOLUTION) ×3 IMPLANT
PACK C SECTION WH (CUSTOM PROCEDURE TRAY) ×3 IMPLANT
PAD OB MATERNITY 4.3X12.25 (PERSONAL CARE ITEMS) ×3 IMPLANT
SUT PDS AB 0 CTX 60 (SUTURE) IMPLANT
SUT VIC AB 0 CT1 27 (SUTURE)
SUT VIC AB 0 CT1 27XBRD ANBCTR (SUTURE) IMPLANT
SUT VIC AB 0 CT1 36 (SUTURE) IMPLANT
SUT VIC AB 2-0 CT1 27 (SUTURE) ×2
SUT VIC AB 2-0 CT1 TAPERPNT 27 (SUTURE) ×1 IMPLANT
SUT VIC AB 2-0 CTX 36 (SUTURE) ×6 IMPLANT
SUT VIC AB 3-0 CT1 27 (SUTURE) ×2
SUT VIC AB 3-0 CT1 TAPERPNT 27 (SUTURE) ×1 IMPLANT
SUT VIC AB 3-0 SH 27 (SUTURE)
SUT VIC AB 3-0 SH 27X BRD (SUTURE) IMPLANT
SYR 30ML LL (SYRINGE) ×3 IMPLANT
TOWEL OR 17X24 6PK STRL BLUE (TOWEL DISPOSABLE) ×3 IMPLANT
TRAY FOLEY CATH 14FR (SET/KITS/TRAYS/PACK) ×3 IMPLANT
WATER STERILE IRR 1000ML POUR (IV SOLUTION) ×3 IMPLANT

## 2013-11-10 NOTE — H&P (Signed)
Dominique Stewart is a 23 y.o. female G2P1001 with IUP at 5965w3d presenting for contractions. Pt states she has been having regular, every 2-3 minutes contractions, associated with none vaginal bleeding.  Membranes are intact, with active fetal movement.  [facility] 20 wks. She has polydramnios with the most recent AFI 23cm on 10/29/13 showing the upper limits of normal, and EFW 86%.  She has had renal pylectasis in the fetus which resolved on 10/29/13.   Prenatal History/Complications:  Past Medical History: Past Medical History  Diagnosis Date  . Allergy     Past Surgical History: Past Surgical History  Procedure Laterality Date  . No past surgeries      Obstetrical History: OB History   Grav Para Term Preterm Abortions TAB SAB Ect Mult Living   2 1 1       1        Social History: History   Social History  . Marital Status: Married    Spouse Name: N/A    Number of Children: N/A  . Years of Education: N/A   Social History Main Topics  . Smoking status: Former Games developermoker  . Smokeless tobacco: Never Used  . Alcohol Use: No  . Drug Use: No  . Sexual Activity: Yes    Birth Control/ Protection: None   Other Topics Concern  . None   Social History Narrative  . None    Family History: Family History  Problem Relation Age of Onset  . Cancer Paternal Grandmother   . Diabetes Paternal Grandmother     Allergies: No Known Allergies  Prescriptions prior to admission  Medication Sig Dispense Refill  . [DISCONTINUED] aluminum & magnesium hydroxide-simethicone (MYLANTA) 500-450-40 MG/5ML suspension Take 15 mLs by mouth daily as needed for indigestion.      . [DISCONTINUED] Prenatal Vit-Fe Fumarate-FA (PRENATAL MULTIVITAMIN) TABS tablet Take 1 tablet by mouth at bedtime.          Prenatal Transfer Tool  Maternal Diabetes: No Genetic Screening: Declined Maternal Ultrasounds/Referrals: Abnormal:  Findings:   Fetal renal pyelectasis Fetal  Ultrasounds or other Referrals:  Referred to Materal Fetal Medicine  Maternal Substance Abuse:  No Significant Maternal Medications:  None Significant Maternal Lab Results: Lab values include: Group B Strep positive     Review of Systems   Constitutional: Negative for fever, chills, weight loss, malaise/fatigue and diaphoresis.  HENT: Negative for hearing loss, ear pain, nosebleeds, congestion, sore throat, neck pain, tinnitus and ear discharge.   Eyes: Negative for blurred vision, double vision, photophobia, pain, discharge and redness.  Respiratory: Negative for cough, hemoptysis, sputum production, shortness of breath, wheezing and stridor.   Cardiovascular: Negative for chest pain, palpitations, orthopnea,  leg swelling  Gastrointestinal: Positive for abdominal pain. Negative for heartburn, nausea, vomiting, diarrhea, constipation, blood in stool Genitourinary: Negative for dysuria, urgency, frequency, hematuria and flank pain.  Musculoskeletal: Negative for myalgias, back pain, joint pain and falls.  Skin: Negative for itching and rash.  Neurological: Negative for dizziness, tingling, tremors, sensory change, speech change, focal weakness, seizures, loss of consciousness, weakness and headaches.  Endo/Heme/Allergies: Negative for environmental allergies and polydipsia. Does not bruise/bleed easily.  Psychiatric/Behavioral: Negative for depression, suicidal ideas, hallucinations, memory loss and substance abuse. The patient is not nervous/anxious and does not have insomnia.       Blood pressure 123/69, pulse 80, temperature 98.1 F (36.7 C), resp. rate 18, height 5' (1.524 m), last menstrual period 01/31/2013. General appearance: alert, cooperative  and mild distress Lungs: clear to auscultation bilaterally Heart: regular rate and rhythm Abdomen: soft, non-tender; bowel sounds normal Pelvic: Proven per previous 7+ lb baby Extremities: Homans sign is negative, no sign of DVT DTR's  Normal Presentation: cephalic Fetal monitoringBaseline: 145 bpm Uterine activityDate/time of onset: 2/10 increasing in frequency at 2200 today, Frequency: Every 2-3 minutes, Duration: 60 seconds and Intensity: strong Dilation: 6 Effacement (%): 90 Station: -1;0 Exam by:: K.WIlson,RN   Prenatal labs: ABO, Rh: O/POS/-- (11/04 1413) Antibody: NEG (11/04 1413) Rubella:   RPR: NON REAC (12/09 1704)  HBsAg: NEGATIVE (11/04 1413)  HIV: NON REACTIVE (12/09 1704)  GBS: Positive (02/05 0000)  1 hr Glucola 107 Genetic screening  Too late Anatomy US fetal renal pylectasis   No results found for this or any previous visit (from the past 24 hour(s)).  Assessment: Dominique Stewart is a 23 y.o. G2P1001 with an IUP at [redacted]w[redacted]d presenting for contractions/active labor  Plan: Admit to birthing suites GBS prophylaxis (rapid with Amp)   Anticipate NSVD Epidural for pain management  Selena Lesser 11/10/2013, 12:53 AM  I have seen and examined this patient and I agree with the above. Cam Hai 4:29 AM 11/10/2013

## 2013-11-10 NOTE — Lactation Note (Signed)
This note was copied from the chart of Dominique Stewart Ganger. Lactation Consultation Note  Patient Name: Dominique Stewart Buehner [id number]'s Date: 11/10/2013 Reason for consult: Initial assessment of this second-time mom and her baby, now 7 hours of age. Mom states she pumped for 2 weeks with her first baby, now 4318 months old because he would not latch but she reports this new baby is latching well and LATCH scores of 8/9 reported by RN staff.  LC demonstrated hand expression and encouraged cue feedings and STS.  Baby is sleeping in STS location with no feeding cues at this time.   LC encouraged review of Baby and Me pp 9, 14 and 20-25 for STS and BF information. LC provided [employer] Resource brochure and reviewed [facility] services and list of community and web site resources.   Maternal Data Formula Feeding for Exclusion: No Infant to breast within first hour of birth: Yes (initial LATCH score=8) Has patient been taught Hand Expression?: Yes (LC demonstrated) Does the patient have breastfeeding experience prior to this delivery?: Yes  Feeding Feeding Type: Breast Fed Length of feed: 25 min  LATCH Score/Interventions Latch: Grasps breast easily, tongue down, lips flanged, rhythmical sucking.  Audible Swallowing: A few with stimulation Intervention(s): Skin to skin  Type of Nipple: Everted at rest and after stimulation  Comfort (Breast/Nipple): Soft / non-tender     Hold (Positioning): No assistance needed to correctly position infant at breast.  LATCH Score: 9 (most recent feeding assessment by RN staff)  Lactation Tools Discussed/Used   STS, cue feedings, hand expression  Consult Status Consult Status: Follow-up Date: 11/11/13 Follow-up type: In-patient    Warrick ParisianBryant, Brandie Lopes [facility] 11/10/2013, 9:57 PM

## 2013-11-10 NOTE — Anesthesia Preprocedure Evaluation (Signed)
Anesthesia Evaluation  Patient identified by MRN, date of birth, ID band Patient awake    Reviewed: Allergy & Precautions, H&P , NPO status , Patient's Chart, lab work & pertinent test results, reviewed documented beta blocker date and time   History of Anesthesia Complications Negative for: history of anesthetic complications  Airway Mallampati: II TM Distance: >3 FB Neck ROM: full    Dental  (+) Teeth Intact   Pulmonary neg pulmonary ROS, former smoker,  breath sounds clear to auscultation        Cardiovascular negative cardio ROS  Rhythm:regular Rate:Normal     Neuro/Psych negative neurological ROS  negative psych ROS   GI/Hepatic negative GI ROS, Neg liver ROS,   Endo/Other  Obese - BMI 34.4  Renal/GU negative Renal ROS  negative genitourinary   Musculoskeletal   Abdominal   Peds  Hematology negative hematology ROS (+)   Anesthesia Other Findings   Reproductive/Obstetrics (+) Pregnancy                           Anesthesia Physical Anesthesia Plan  ASA: II  Anesthesia Plan: Epidural   Post-op Pain Management:    Induction:   Airway Management Planned:   Additional Equipment:   Intra-op Plan:   Post-operative Plan:   Informed Consent: I have reviewed the patients History and Physical, chart, labs and discussed the procedure including the risks, benefits and alternatives for the proposed anesthesia with the patient or authorized representative who has indicated his/her understanding and acceptance.     Plan Discussed with:   Anesthesia Plan Comments:         Anesthesia Quick Evaluation

## 2013-11-10 NOTE — Anesthesia Postprocedure Evaluation (Signed)
Anesthesia Post Note  Patient: Dominique Stewart  Procedure(s) Performed: Procedure(s) (LRB): CESAREAN SECTION (N/A)  Anesthesia type: Epidural  Patient location: PACU  Post pain: Pain level controlled  Post assessment: Post-op Vital signs reviewed  Last Vitals:  Filed Vitals:   11/10/13 1444  BP:   Pulse: 88  Temp: 36.6 C  Resp:     Post vital signs: Reviewed  Level of consciousness: awake  Complications: No apparent anesthesia complications

## 2013-11-10 NOTE — Op Note (Signed)
Dominique BeachAlyssa Alwin PROCEDURE DATE: 11/09/2013 - 11/10/2013  PREOPERATIVE DIAGNOSES: Intrauterine pregnancy at  237w3d weeks gestation; failure to progress  POSTOPERATIVE DIAGNOSES: The same  PROCEDURE: Primary Low Transverse Cesarean Section  SURGEON:  Dr. Nicholaus BloomMyra Dove  ASSISTANT:  Dr. Rulon AbideKeli Augustina Braddock  ANESTHESIOLOGIST: Dr. Lilli LightHatchet  INDICATIONS: Dominique Stewart is a 23 y.o. G2P1001 at 3437w3d here for cesarean section secondary to the indications listed under preoperative diagnoses; please see preoperative note for further details.  The risks of cesarean section were discussed with the patient including but were not limited to: bleeding which may require transfusion or reoperation; infection which may require antibiotics; injury to bowel, bladder, ureters or other surrounding organs; injury to the fetus; need for additional procedures including hysterectomy in the event of a life-threatening hemorrhage; placental abnormalities wth subsequent pregnancies, incisional problems, thromboembolic phenomenon and other postoperative/anesthesia complications.   The patient concurred with the proposed plan, giving informed written consent for the procedure.    FINDINGS:  Viable female infant in cephalic presentation.  Apgars9  and 9.  Meconium stained amniotic fluid.  Intact placenta, three vessel cord.  Normal uterus, fallopian tubes and ovaries bilaterally.  ANESTHESIA: Spinal INTRAVENOUS FLUIDS: 2400 ml ESTIMATED BLOOD LOSS: 700 ml URINE OUTPUT:  600 ml SPECIMENS: Placenta sent to L&D COMPLICATIONS: None immediate  PROCEDURE IN DETAIL:  The patient preoperatively received intravenous antibiotics and had sequential compression devices applied to her lower extremities.  She was then taken to the operating room where the epidural anesthesia was dosed up to surgical level and was found to be adequate. She was then placed in a dorsal supine position with a leftward tilt, and prepped and draped in a sterile manner.  A foley  catheter was placed into her bladder and attached to constant gravity.   Timeout procedure was done. After adequate anesthesia was assured 30 mL for 0.5% Marcaine was injected into the subcutaneous tissue about 2 cm above the symphysis pubis. An incision was made there. The incision was carried down through the subcutaneous tissue to the fascia. The fascia was scored the midline and extended bilaterally. The middle of the rectus was separated with electrocautery taking care to preserve the muscle integrity. The peritoneum was entered with hemostats. Peritoneal incision was extended bilaterally manually. The bladder blade was placed. A transverse incision was made on the well-developed lower uterine segment. The uterine incision was extended with traction on each side. Amniotomy was performed with a hemostat. Meconium stained fluid was noted. The baby was delivered without difficulty.  The baby's cord was clamped and cut, and he was transferred to the NICU personnel for routine care. The placenta was delivered intact with traction. The uterus was left in situ and the interior was cleaned with a dry lap sponge. The uterine incision was closed with 2-0 Vicryl running locking suture in one layer. 2 figure of 8 sutures with 2-0 Vicryl were used to ensure hemostasis. Excellent hemostasis was noted. By tilting the uterus each side was able to visualize the adnexa, and they were normal. The rectus fascia rectus muscles were noted be hemostatic as well. The fascia was closed with a 0 Vicryl suture in a running nonlocking fashion. No defects were palpable. The subcutaneous tissue was irrigated, clean, and dried. A subcuticular closure was done with a 4-0 Vicryl suture. Excellent cosmetic results were obtained. She was taken to the recovery room in stable condition. She tolerated the procedure well.   Deajah Erkkila, Redmond BasemanKELI L, MD

## 2013-11-10 NOTE — Transfer of Care (Signed)
Immediate Anesthesia Transfer of Care Note  Patient: Dominique Stewart  Procedure(s) Performed: Procedure(s): CESAREAN SECTION (N/A)  Patient Location: PACU  Anesthesia Type:Epidural  Level of Consciousness: awake and alert   Airway & Oxygen Therapy: Patient Spontanous Breathing  Post-op Assessment: Report given to PACU RN and Post -op Vital signs reviewed and stable  Post vital signs: Reviewed and stable  Complications: No apparent anesthesia complications

## 2013-11-10 NOTE — Progress Notes (Signed)
Dominique Stewart is a 23 y.o. G2P1001 at 7837w3d admitted for active labor  Subjective: Comfortable w/ epidural, no urge to push. Pt wanting to wait until husband gets here when he gets off of work @ 1100 to arom  Objective: BP 122/70  Pulse 86  Temp(Src) 97.4 F (36.3 C) (Oral)  Resp 18  Ht 5' (1.524 m)  Wt 84.823 kg (187 lb)  BMI 36.52 kg/m2  SpO2 98%  LMP 01/31/2013   Total I/O In: -  Out: 450 [Urine:450]  FHT:  FHR: 140 bpm, variability: moderate,  accelerations:  Present,  decelerations:  Absent UC:   regular, every 1-3 minutes SVE:  Lip/70/-1 by LLima RN @ [phone] [id number], BBOW  Labs: Lab Results  Component Value Date   WBC 9.6 11/10/2013   HGB 11.6* 11/10/2013   HCT 34.4* 11/10/2013   MCV 83.9 11/10/2013   PLT 180 11/10/2013    Assessment / Plan: SOL, lip since 0700, BBOW but pt wants to wait until husband gets off of work to come be w/ her at 11:00 to arom.    Labor: active phase Preeclampsia:  n/a Fetal Wellbeing:  Category I Pain Control:  Epidural I/D:  ampicillin for gbs+, has received 2 doses Anticipated MOD:  NSVD  Marge DuncansBooker, Mehar Sagen Randall 11/10/2013

## 2013-11-10 NOTE — Progress Notes (Signed)
S. Comfortable O. VSS, AF, FHR- Category 1 CVX- 9.5/90/-3 A/P. No cervical change for 6 hours. I offered a PLTCS versus another hour of waiting. She prefers to proceed with C/S. Declines PPS.

## 2013-11-10 NOTE — MAU Note (Signed)
Pt stated she has been having ctx since 2200. Denies SROM or bleeding. Good fetal movement reported.

## 2013-11-10 NOTE — Progress Notes (Signed)
Artist Beachlyssa Meece is a 23 y.o. G2P1001 at 6870w3d  Subjective: Comfortable with epidural; no urge to push  Objective: BP 98/52  Pulse 72  Temp(Src) 97.7 F (36.5 C) (Oral)  Resp 16  Ht 5' (1.524 m)  Wt 84.823 kg (187 lb)  BMI 36.52 kg/m2  SpO2 98%  LMP 01/31/2013      FHT:  FHR: 120-130 bpm, variability: moderate,  accelerations:  Present,  decelerations:  Absent UC:   irregular, every 2-5 minutes SVE:   Dilation: Lip/rim Effacement (%): 70 Station: -1 Exam by:: Selena Lesserina Braimah, CNM  Labs: Lab Results  Component Value Date   WBC 9.6 11/10/2013   HGB 11.6* 11/10/2013   HCT 34.4* 11/10/2013   MCV 83.9 11/10/2013   PLT 180 11/10/2013    Assessment / Plan: Transition  Will allow to labor down x 1-2 hrs or until urge to push  Tyriq Moragne 11/10/2013, 7:14 AM

## 2013-11-10 NOTE — Anesthesia Procedure Notes (Signed)
Epidural Patient location during procedure: OB Start time: 11/10/2013 2:02 AM  Staffing Performed by: anesthesiologist   Preanesthetic Checklist Completed: patient identified, site marked, surgical consent, pre-op evaluation, timeout performed, IV checked, risks and benefits discussed and monitors and equipment checked  Epidural Patient position: sitting Prep: site prepped and draped and DuraPrep Patient monitoring: continuous pulse ox and blood pressure Approach: midline Injection technique: LOR air  Needle:  Needle type: Tuohy  Needle gauge: 17 G Needle length: 9 cm and 9 Needle insertion depth: 7 cm Catheter type: closed end flexible Catheter size: 19 Gauge Catheter at skin depth: 12 cm Test dose: negative  Assessment Events: blood not aspirated, injection not painful, no injection resistance, negative IV test and no paresthesia  Additional Notes Discussed risk of headache, infection, bleeding, nerve injury and failed or incomplete block.  Patient voices understanding and wishes to proceed.  Epidural placed easily on first attempt.  No paresthesia.  Patient tolerated procedure well with no apparent complications.  Jasmine DecemberA. Mallie Linnemann, MDReason for block:procedure for pain

## 2013-11-11 LAB — CBC
HEMATOCRIT: 27.3 % — AB (ref 36.0–46.0)
Hemoglobin: 9.2 g/dL — ABNORMAL LOW (ref 12.0–15.0)
MCH: 28.8 pg (ref 26.0–34.0)
MCHC: 33.7 g/dL (ref 30.0–36.0)
MCV: 85.3 fL (ref 78.0–100.0)
Platelets: 163 10*3/uL (ref 150–400)
RBC: 3.2 MIL/uL — ABNORMAL LOW (ref 3.87–5.11)
RDW: 14.9 % (ref 11.5–15.5)
WBC: 9.6 10*3/uL (ref 4.0–10.5)

## 2013-11-11 NOTE — Anesthesia Postprocedure Evaluation (Signed)
  Anesthesia Post-op Note  Patient: Dominique BeachAlyssa Stewart  Procedure(s) Performed: Procedure(s): CESAREAN SECTION (N/A)  Patient Location: Mother/Baby  Anesthesia Type:Epidural  Level of Consciousness: awake and alert   Airway and Oxygen Therapy: Patient Spontanous Breathing  Post-op Pain: mild  Post-op Assessment: Patient's Cardiovascular Status Stable, Respiratory Function Stable, No signs of Nausea or vomiting, Pain level controlled, No headache, No residual numbness and No residual motor weakness  Post-op Vital Signs: stable  Complications: No apparent anesthesia complications

## 2013-11-11 NOTE — Lactation Note (Signed)
This note was copied from the chart of Dominique Stewart Maertens. Lactation Consultation Note  Patient Name: Dominique Stewart Baines NWGNF'AToday's Date: 11/11/2013  [facility] mom having a "cracked" (L) nipple which is actually dimpled and partially inverted which causes irritation of inner tissue when baby is latched but no bleeding or blisters seen when at rest.  Mom says she is able to latch baby on both sides and express colostrum. Comfort gelpads given w/instructions for use, as well as continuing to apply ebm to nipples before and after feedings and allow some air-drying of nipples prior to applying gelpads. LC also encouraged continued cue feedings.   Maternal Data    Feeding Feeding Type: Breast Fed Length of feed: 40 min  LATCH Score/Interventions Latch: Grasps breast easily, tongue down, lips flanged, rhythmical sucking.  Audible Swallowing: A few with stimulation  Type of Nipple: Everted at rest and after stimulation  Comfort (Breast/Nipple): Soft / non-tender     Hold (Positioning): No assistance needed to correctly position infant at breast.  LATCH Score: 9 (most recent feeding assessment, per RN)  Lactation Tools Discussed/Used   Comfort gelpads Nipple care Cue feedings  Consult Status   LC to followup tomorrow   Lynda RainwaterBryant, Isaia Hassell Parmly 11/11/2013, 10:49 PM

## 2013-11-11 NOTE — Addendum Note (Signed)
Addendum created 11/11/13 0947 by Angela Adamana G Rennee Coyne, CRNA   Modules edited: Notes Section   Notes Section:  File: [id number]

## 2013-11-11 NOTE — Progress Notes (Signed)
Subjective: Postpartum Day 1: Cesarean Delivery Patient reports mild incisional pain with movt, moderate bleeding that is lighter than yesterday. Not voided yet, catheter removed at 6 am No flatus or BM Up and walking Tolerating PO  Objective: Vital signs in last 24 hours: Temp:  [97.4 F (36.3 C)-98.5 F (36.9 C)] 97.6 F (36.4 C) (02/15 0430) Pulse Rate:  [66-94] 67 (02/15 0430) Resp:  [15-21] 18 (02/15 0430) BP: (85-125)/(41-75) 93/57 mmHg (02/15 0430) SpO2:  [94 %-100 %] 96 % (02/15 0430)  Physical Exam:  General: alert, cooperative and no distress Lochia: appropriate Uterine Fundus: firm Incision: healing well, no significant drainage DVT Evaluation: No evidence of DVT seen on physical exam. Negative Homan's sign.   Recent Labs  11/10/13 0050 11/11/13 0632  HGB 11.6* 9.2*  HCT 34.4* 27.3*    Assessment/Plan: Status post Cesarean section. Doing well postoperatively.  Continue current care.  Dominique Stewart 11/11/2013, 7:03 AM

## 2013-11-11 NOTE — Progress Notes (Signed)
I spoke with and examined patient and agree with medical student's note and plan of care.  Breastfeeding, plans for nexplanon for contraception, declines circumcision  Cheral MarkerKimberly R. Bacilio Abascal, CNM, [facility]NP-BC 11/11/2013 7:24 AM

## 2013-11-12 ENCOUNTER — Encounter (HOSPITAL_COMMUNITY): Payer: Self-pay | Admitting: Obstetrics & Gynecology

## 2013-11-12 MED ORDER — OXYCODONE-ACETAMINOPHEN 5-325 MG PO TABS: 1.0000 | ORAL_TABLET | ORAL | Status: DC | PRN

## 2013-11-12 MED ORDER — IBUPROFEN 600 MG PO TABS: 600.0000 mg | ORAL_TABLET | Freq: Four times a day (QID) | ORAL | Status: DC | PRN

## 2013-11-12 NOTE — Discharge Instructions (Signed)
Cesarean Delivery °Care After °Refer to this sheet in the next few weeks. These instructions provide you with information on caring for yourself after your procedure. Your health care provider may also give you specific instructions. Your treatment has been planned according to current medical practices, but problems sometimes occur. Call your health care provider if you have any problems or questions after you go home. °HOME CARE INSTRUCTIONS  °· Only take over-the-counter or prescription medications as directed by your health care provider. °· Do not drink alcohol, especially if you are breastfeeding or taking medication to relieve pain. °· Do not chew or smoke tobacco. °· Continue to use good perineal care. Good perineal care includes: °· Wiping your perineum from front to back. °· Keeping your perineum clean. °· Check your surgical cut (incision) daily for increased redness, drainage, swelling, or separation of skin. °· Clean your incision gently with soap and water every day, and then pat it dry. If your health care provider says it is OK, leave the incision uncovered. Use a bandage (dressing) if the incision is draining fluid or appears irritated. If the adhesive strips across the incision do not fall off within 7 days, carefully peel them off. °· Hug a pillow when coughing or sneezing until your incision is healed. This helps to relieve pain. °· Do not use tampons or douche until your health care provider says it is okay. °· Shower, wash your hair, and take tub baths as directed by your health care provider. °· Wear a well-fitting bra that provides breast support. °· Limit wearing support panties or control-top hose. °· Drink enough fluids to keep your urine clear or pale yellow. °· Eat high-fiber foods such as whole grain cereals and breads, brown rice, beans, and fresh fruits and vegetables every day. These foods may help prevent or relieve constipation. °· Resume activities such as climbing stairs,  driving, lifting, exercising, or traveling as directed by your health care provider. °· Talk to your health care provider about resuming sexual activities. This is dependent upon your risk of infection, your rate of healing, and your comfort and desire to resume sexual activity. °· Try to have someone help you with your household activities and your newborn for at least a few days after you leave the hospital. °· Rest as much as possible. Try to rest or take a nap when your newborn is sleeping. °· Increase your activities gradually. °· Keep all of your scheduled postpartum appointments. It is very important to keep your scheduled follow-up appointments. At these appointments, your health care provider will be checking to make sure that you are healing physically and emotionally. °SEEK MEDICAL CARE IF:  °· You are passing large clots from your vagina. Save any clots to show your health care provider. °· You have a foul smelling discharge from your vagina. °· You have trouble urinating. °· You are urinating frequently. °· You have pain when you urinate. °· You have a change in your bowel movements. °· You have increasing redness, pain, or swelling near your incision. °· You have pus draining from your incision. °· Your incision is separating. °· You have painful, hard, or reddened breasts. °· You have a severe headache. °· You have blurred vision or see spots. °· You feel sad or depressed. °· You have thoughts of hurting yourself or your newborn. °· You have questions about your care, the care of your newborn, or medications. °· You are dizzy or lightheaded. °· You have a rash. °· You   have pain, redness, or swelling at the site of the removed intravenous access (IV) tube. °· You have nausea or vomiting. °· You stopped breastfeeding and have not had a menstrual period within 12 weeks of stopping. °· You are not breastfeeding and have not had a menstrual period within 12 weeks of delivery. °· You have a fever. °SEEK  IMMEDIATE MEDICAL CARE IF: °· You have persistent pain. °· You have chest pain. °· You have shortness of breath. °· You faint. °· You have leg pain. °· You have stomach pain. °· Your vaginal bleeding saturates 2 or more sanitary pads in 1 hour. °MAKE SURE YOU:  °· Understand these instructions. °· Will watch your condition. °· Will get help right away if you are not doing well or get worse. °Document Released: 06/05/2002 Document Revised: 05/16/2013 Document Reviewed: 05/10/2012 °ExitCare® Patient Information ©2014 [employer]. ° ° ° °

## 2013-11-12 NOTE — Discharge Summary (Signed)
  Obstetric Discharge Summary Reason for Admission: onset of labor Prenatal Procedures: NST Intrapartum Procedures: cesarean: low cervical, transverse and GBS prophylaxis Postpartum Procedures: none Complications-Operative and Postpartum: none Hemoglobin  Date Value Ref Range Status  11/11/2013 9.2* 12.0 - 15.0 g/dL Final     REPEATED TO VERIFY     DELTA CHECK NOTED     HCT  Date Value Ref Range Status  11/11/2013 27.3* 36.0 - 46.0 % Final   Hospital Course: Artist Beachlyssa Piontek is a 23 y.o. [id number] admitted with SOOL at 7059w3d. Pt had a PLTCS due to failure to progress. Uncomplicated Intra Operative and Post partum course. Pt is doing well today, no complaints. Ready for discharge F/U in 4-6 weeks  Physical Exam:  General: alert, cooperative and no distress Lochia: appropriate Uterine Fundus: firm Incision: healing well, no significant drainage DVT Evaluation: No evidence of DVT seen on physical exam. Negative Homan's sign.  Discharge Diagnoses: Term Pregnancy-delivered  Discharge Information: Date: 11/12/2013 Activity: pelvic rest Diet: routine Medications: Colace, Iron and Percocet Condition: stable Instructions: refer to practice specific booklet Discharge to: home   Newborn Data: Live born female  Birth Weight: 9 lb 0.8 oz (4105 g) APGAR: 9, 9  Home with mother.  Sallyanne HaversMuazu, Aisha 11/12/2013, 7:39 AM  I have seen this patient and agree with the above student's note.  LEFTWICH-KIRBY, Donnika Kucher Certified Nurse-Midwife

## 2013-11-12 NOTE — Progress Notes (Signed)
Ur chart review completed.  

## 2013-11-15 ENCOUNTER — Encounter: Payer: Medicaid Other | Admitting: Obstetrics & Gynecology

## 2013-11-24 NOTE — H&P (Signed)
Attestation of Attending Supervision of Advanced Practitioner: Evaluation and management procedures were performed by the PA/NP/CNM/OB Fellow under my supervision/collaboration. Chart reviewed and agree with management and plan.  Farzana Koci V 11/24/2013 8:08 PM

## 2013-11-27 ENCOUNTER — Encounter (HOSPITAL_COMMUNITY): Payer: Self-pay | Admitting: Obstetrics & Gynecology

## 2013-11-27 NOTE — Addendum Note (Signed)
Addendum created 11/27/13 2029 by Leilani AbleFranklin Scarlet Abad, MD   Modules edited: Anesthesia Events

## 2014-01-02 ENCOUNTER — Ambulatory Visit: Payer: Medicaid Other | Admitting: Obstetrics & Gynecology

## 2014-01-04 ENCOUNTER — Telehealth: Payer: Self-pay | Admitting: General Practice

## 2014-01-04 ENCOUNTER — Encounter: Payer: Self-pay | Admitting: General Practice

## 2014-01-04 NOTE — Telephone Encounter (Signed)
Patient missed pp visit on 4/8. Called patient, no answer- and unable to leave message due to VM not being set up. Will send letter

## 2014-01-22 ENCOUNTER — Encounter (HOSPITAL_COMMUNITY): Payer: Self-pay | Admitting: Emergency Medicine

## 2014-01-22 ENCOUNTER — Emergency Department (HOSPITAL_COMMUNITY)
Admission: EM | Admit: 2014-01-22 | Discharge: 2014-01-22 | Disposition: A | Discharge status: Home / Self Care | Payer: Medicaid Other | Attending: Emergency Medicine | Admitting: Emergency Medicine

## 2014-01-22 ENCOUNTER — Emergency Department (HOSPITAL_COMMUNITY): Payer: Medicaid Other

## 2014-01-22 DIAGNOSIS — Z87891 Personal history of nicotine dependence: Secondary | ICD-10-CM | POA: Insufficient documentation

## 2014-01-22 DIAGNOSIS — Y9389 Activity, other specified: Secondary | ICD-10-CM | POA: Insufficient documentation

## 2014-01-22 DIAGNOSIS — Z79899 Other long term (current) drug therapy: Secondary | ICD-10-CM | POA: Insufficient documentation

## 2014-01-22 DIAGNOSIS — IMO0002 Reserved for concepts with insufficient information to code with codable children: Secondary | ICD-10-CM | POA: Insufficient documentation

## 2014-01-22 DIAGNOSIS — L03019 Cellulitis of unspecified finger: Secondary | ICD-10-CM | POA: Insufficient documentation

## 2014-01-22 DIAGNOSIS — L089 Local infection of the skin and subcutaneous tissue, unspecified: Secondary | ICD-10-CM | POA: Insufficient documentation

## 2014-01-22 DIAGNOSIS — Y9289 Other specified places as the place of occurrence of the external cause: Secondary | ICD-10-CM | POA: Insufficient documentation

## 2014-01-22 DIAGNOSIS — S60949A Unspecified superficial injury of unspecified finger, initial encounter: Principal | ICD-10-CM

## 2014-01-22 DIAGNOSIS — Z791 Long term (current) use of non-steroidal anti-inflammatories (NSAID): Secondary | ICD-10-CM | POA: Insufficient documentation

## 2014-01-22 MED ORDER — SULFAMETHOXAZOLE-TRIMETHOPRIM 800-160 MG PO TABS: 1.0000 | ORAL_TABLET | Freq: Two times a day (BID) | ORAL | Status: AC

## 2014-01-22 MED ORDER — CEPHALEXIN 500 MG PO CAPS: 500.0000 mg | ORAL_CAPSULE | Freq: Four times a day (QID) | ORAL | Status: AC

## 2014-01-22 NOTE — ED Provider Notes (Signed)
CSN: [id number]     Arrival date & time 01/22/14  1302 History  This chart was scribed for non-physician practitioner working with Harrold Donath R. Rubin Payor, MD by Ashley Jacobs, ED scribe. This patient was seen in room WTR8/WTR8 and the patient's care was started at 1:43 PM.  First MD Initiated Contact with Patient 01/22/14 1314     Chief Complaint  Patient presents with  . Finger Injury    swollen and discolored nasil bed-r/index finger     (Consider location/radiation/quality/duration/timing/severity/associated sxs/prior Treatment) HPI HPI Comments: Dominique Stewart is a 23 y.o. female who presents to the ED with right index finger pain. Patient reported that approximately 2 weeks ago patient was placing objects in the trunk of her car and stated that she hit the tip of her right index finger. Reported that after that occurred she noticed swelling and pain to her right index finger. Patient reported a constant throbbing sensation with radiation to her right wrist. Patient reported that she noticed some drainage earlier today, reported that she was giving her daughter a bath and noticed that her index finger was draining pus - stated that she placed peroxide on her finger.  Stated that she has not been taking any medications for the pain. Denied numbness, tingling, loss of sensation, chills, fever, red streaks, prior injury.   Pt does not have a current PCP. Past Medical History  Diagnosis Date  . Allergy    Past Surgical History  Procedure Laterality Date  . No past surgeries    . Cesarean section N/A 11/10/2013    Procedure: CESAREAN SECTION;  Surgeon: Allie Bossier, MD;  Location: [facility];  Service: Obstetrics;  Laterality: N/A;   Family History  Problem Relation Age of Onset  . Cancer Paternal Grandmother   . Diabetes Paternal Grandmother    History  Substance Use Topics  . Smoking status: Former Games developer  . Smokeless tobacco: Never Used  . Alcohol Use: No   OB History   Grav Para  Term Preterm Abortions TAB SAB Ect Mult Living   2 2 2       2      Review of Systems  Constitutional: Negative for fever and chills.  Musculoskeletal: Positive for arthralgias.       Right finger pain and swelling   Skin:       Right index finger discoloration No red streaks  Neurological: Negative for weakness and numbness.  All other systems reviewed and are negative.     Allergies  Review of patient's allergies indicates no known allergies.  Home Medications   Prior to Admission medications   Medication Sig Start Date End Date Taking? Authorizing Provider  aluminum & magnesium hydroxide-simethicone (MYLANTA) 500-450-40 MG/5ML suspension Take 15 mLs by mouth daily as needed for indigestion.    Historical Provider, MD  ibuprofen (ADVIL,MOTRIN) 600 MG tablet Take 1 tablet (600 mg total) by mouth every 6 (six) hours as needed for mild pain. 11/12/13   Wilmer Floor Leftwich-Kirby, CNM  oxyCODONE-acetaminophen (PERCOCET/ROXICET) 5-325 MG per tablet Take 1-2 tablets by mouth every 4 (four) hours as needed for severe pain (moderate - severe pain). 11/12/13   Hurshel Party, CNM  Prenatal Vit-Fe Fumarate-FA (PRENATAL MULTIVITAMIN) TABS tablet Take 1 tablet by mouth daily at 12 noon.    Historical Provider, MD   BP 109/64  Pulse 68  Temp(Src) 98.2 F (36.8 C) (Oral)  Resp 18  Wt 180 lb (81.647 kg)  SpO2 100%  LMP 11/28/2013  Breastfeeding?  No Physical Exam  Nursing note and vitals reviewed. Constitutional: She is oriented to person, place, and time. She appears well-developed and well-nourished. No distress.  HENT:  Head: Normocephalic and atraumatic.  Eyes: Conjunctivae and EOM are normal. Pupils are equal, round, and reactive to light. Right eye exhibits no discharge. Left eye exhibits no discharge.  Neck: Normal range of motion. Neck supple.  Cardiovascular: Normal rate, regular rhythm and normal heart sounds.  Exam reveals no friction rub.   No murmur heard. Pulses:       Radial pulses are 2+ on the right side, and 2+ on the left side.  Cap refill less than 3 seconds  Pulmonary/Chest: Effort normal and breath sounds normal. No respiratory distress. She has no wheezes. She has no rales.  Musculoskeletal: Normal range of motion. She exhibits tenderness.  Full ROM to upper and lower extremities without difficulty noted, negative ataxia noted.  Neurological: She is alert and oriented to person, place, and time. No cranial nerve deficit. She exhibits normal muscle tone. Coordination normal.  Cranial nerves III-XII grossly intact Strength 5+/5+ to upper and lower extremities bilaterally with resistance applied, equal distribution noted Strength intact to MCP, PIP, DIP joints of right hand Sensation intact with differentiation to sharp and dull touch to the right upper extremity-right hand  Skin: Skin is warm. She is not diaphoretic. There is erythema.  Paronychia identified to the right index finger at the base of the nailbed. Discomfort upon palpation. Negative pain upon palpation to the finger pad-doubt phallon. Negative red streaks. Negative warmth upon palpation.    ED Course  Procedures (including critical care time) DIAGNOSTIC STUDIES: Oxygen Saturation is 100% on room airt, normal by my interpretation.    COORDINATION OF CARE:  1:46 PM Discussed course of care with pt . Pt understands and agrees.  INCISION AND DRAINAGE Performed by: Raymon MuttonMarissa Jayel Inks Consent: Verbal consent obtained. Risks and benefits: risks, benefits and alternatives were discussed Type: paronychia  Body area: right index finger Incision was made with a scalpel. Drainage: purulent Drainage amount: 2-3 cc Patient tolerance: Patient tolerated the procedure well with no immediate complications.    Labs Review Labs Reviewed - No data to display  Imaging Review Dg Wrist Complete Right  01/22/2014   CLINICAL DATA:  FINGER INJURY  EXAM: RIGHT WRIST - COMPLETE 3+ VIEW  COMPARISON:   None.  FINDINGS: There is no evidence of fracture or dislocation. There is no evidence of arthropathy or other focal bone abnormality. Soft tissues are unremarkable.  IMPRESSION: Negative.   Electronically Signed   By: Salome HolmesHector  Cooper M.D.   On: 01/22/2014 15:11   Dg Hand Complete Right  01/22/2014   CLINICAL DATA:  Injury.  EXAM: RIGHT HAND - COMPLETE 3+ VIEW  COMPARISON:  None.  FINDINGS: Lucency noted along the region of the epiphyseal plate, this is most likely a remnant of the epiphyseal plate as opposed to fracture. If symptoms persist follow up imaging in 03/2009 days can be obtained. No other focal abnormality.  IMPRESSION: Subtle lucency noted along the region of the epiphyseal plate of the distal radius, most likely remnant of the epiphyseal plate. No acute abnormality otherwise noted .   Electronically Signed   By: Maisie Fushomas  Register   On: 01/22/2014 15:04     EKG Interpretation None      MDM   Final diagnoses:  Paronychia    Filed Vitals:   01/22/14 1324  BP: 109/64  Pulse: 68  Temp: 98.2 F (36.8  C)  TempSrc: Oral  Resp: 18  Weight: 180 lb (81.647 kg)  SpO2: 100%   I personally performed the services described in this documentation, which was scribed in my presence. The recorded information has been reviewed and is accurate.  Plain film of right hand identified subtle lucency noted at the region of the upper thigh supplied the distal radius most likely remnant of appetite the plate-no acute abnormalities noted. Right wrist negative for acute abnormalities. Doubt phallon - negative pain, swelling, erythema, inflammation noted to the finger pad of the right index finger. Suspicion to be paronychia with swelling and pus accumulation at the nailbed. Paronychia drained while in ED setting-patient tolerated procedure well - decent amount of pus drained. Negative red streaks noted. Negative septic signs. Negative focal neurological deficits noted-patient is full range of motion noted  to the finger and is able to move well, strength intact as well as sensation with differentiation sharp and dull touch. Patient stable, afebrile. Discharged patient with antibiotics. Discussed with patient to apply warm soaks and massage to aid in pus drainage to occur. Referred patient to hand specialist and primary care provider. Discussed with patient not to breast-feed while on medications. Discussed with patient to closely monitor symptoms and if symptoms are to worsen or change to report back to the ED - strict return instructions given.  Patient agreed to plan of care, understood, all questions answered.     Raymon MuttonMarissa Leya Paige, PA-C 01/22/14 2148

## 2014-01-22 NOTE — ED Notes (Addendum)
Blunt trauma to nailbed on r/hand-index finger.Injury  occurred  two weeks ago. Reports purulent drainage this am. Pt also reported pain to r/wrist x 1 day

## 2014-01-22 NOTE — Discharge Instructions (Signed)
Please call your doctor for a followup appointment within 24-48 hours. When you talk to your doctor please let them know that you were seen in the emergency department and have them acquire all of your records so that they can discuss the findings with you and formulate a treatment plan to fully care for your new and ongoing problems. Please call and set-up appointment with the hand specialist as soon as you were discharged Please take antibiotics as prescribed and on a full stomach Please silk finger in warm water and massage to aid in drainage Please continue to monitor symptoms closely and if symptoms are to worsen or change (fever greater than 101, swelling to the finger, redness to the finger, warmth to the finger, red streaks running down the finger, decreased motion to the finger, worsening or changes to pain, increased accumulation of pus at the nailbed, pain upon palpation to the tip of the finger) please report back to the ED immediately.   Paronychia  Paronychia is an infection of the skin caused by germs. It happens by the fingernail or toenail. You can avoid it by not:  Pulling on hangnails.  Nail biting.  Thumb sucking.  Cutting fingernails and toenails too short.  Cutting the skin at the base and sides of the fingernail or toenail (cuticle). HOME CARE  Keep the fingers or toes very dry. Put rubber gloves over cotton gloves when putting hands in water.  Keep the wound clean and bandaged (dressed) as told by your doctor.  Soak the fingers or toes in warm water for 15 to 20 minutes. Soak them 3 to 4 times per day for germ infections. Fungal infections are difficult to treat. Fungal infections often require treatment for a long time.  Only take medicine as told by your doctor. GET HELP RIGHT AWAY IF:   You have redness, puffiness (swelling), or pain that gets worse.  You see yellowish-white fluid (pus) coming from the wound.  You have a fever.  You have a bad smell  coming from the wound or bandage. MAKE SURE YOU:  Understand these instructions.  Will watch your condition.  Will get help if you are not doing well or get worse. Document Released: 09/01/2009 Document Revised: 12/06/2011 Document Reviewed: 09/01/2009 [facility] Patient Information 2014 [place]Care, [place].   Emergency Department Resource Guide 1) Find a Doctor and Pay Out of Pocket Although you won't have to find out who is covered by your insurance plan, it is a good idea to ask around and get recommendations. You will then need to call the office and see if the doctor you have chosen will accept you as a new patient and what types of options they offer for patients who are self-pay. Some doctors offer discounts or will set up payment plans for their patients who do not have insurance, but you will need to ask so you aren't surprised when you get to your appointment.  2) Contact Your Local Health Department Not all health departments have doctors that can see patients for sick visits, but many do, so it is worth a call to see if yours does. If you don't know where your local health department is, you can check in your phone book. The CDC also has a tool to help you locate your state's health department, and many state websites also have listings of all of their local health departments.  3) Find a Walk-in Clinic If your illness is not likely to be very severe or complicated, you  may want to try a walk in clinic. These are popping up all over the country in pharmacies, drugstores, and shopping centers. They're usually staffed by nurse practitioners or physician assistants that have been trained to treat common illnesses and complaints. They're usually fairly quick and inexpensive. However, if you have serious medical issues or chronic medical problems, these are probably not your best option.  No Primary Care Doctor: - Call Health Connect at  [phone] - they can help you locate a primary care  doctor that  accepts your insurance, provides certain services, etc. - Physician Referral Service- [phone]  Chronic Pain Problems: Organization         Address  Phone   Notes  [facility]  [phone]) [phone] Patients need to be referred by their primary care doctor.   Medication Assistance: Organization         Address  Phone   Notes  [facility] Medication [facility] [address] [place], [place] [address] [phone] --Must be a resident of [facility] -- Must have NO insurance coverage whatsoever (no Medicaid/ Medicare, etc.) -- The pt. MUST have a primary care doctor that directs their care regularly and follows them in the community   MedAssist  [phone]   [employer]  [phone]) [phone]    Agencies that provide inexpensive medical care: Organization         Address  Phone   Notes  [facility]  [phone]   [facility]    [phone]) [phone]   [facility] [address] [place], [place] [address] [phone]) [phone]   [facility] [address], [place] [phone]   Planned Parenthood    [phone]) [phone]   [facility]    [phone]   [facility]  [address], [place] Phone:  [phone]) [phone], Fax:  [phone](336) [phone] Hours of Operation:  9 am - 6 pm, M-F.  Also accepts Medicaid/Medicare and self-pay.  [facility]  [address], [place] Phone: [phone](336) [phone], Fax: [phone]. Hours of Operation:  8:30 am - 5:30 pm, M-F.  Also accepts Medicaid and self-pay.  [facility] [place] [address], [place] Phone: [phone](336) [phone]   [facility] [address], [place], [place] [phone], Ext. [phone] Mondays & Thursdays: 7-9 AM.  First 15 patients are seen on a first come, first serve basis.    Medicaid-accepting [facility]  Providers:  Organization         Address  Phone   Notes  [facility] [address], [place] [phone] Also accepts self-pay patients.  [facility] [address], [place]  [phone]) [phone]   [facility] [address], [place] [phone]) [phone]   [facility] [address] [place] [address], [place] [phone]) [phone]   Renaye RakersVeita Bland [address], [place]   [phone] Only accepts [place] Access [place]icaid patients after they have their name applied to their card.   Self-Pay (no insurance) in [facility]:  Organization         Address  Phone   Notes  Sickle Cell Patients, [facility] [address], [place] [phone]   [facility] [address], [place] (  [facility] [place]  [address], [address],  [phone]   [facility]/Dr. Osei-Bonsu  [address] [place] or [address], [place] [phone] Phone number for both [place] and [place] locations is the same.  [facility] [address], [place] [phone]   [facility] [address], [place] or [address] [phone]   [facility] [address], [place] [phone], phone; [phone], fax Sees patients 1st and 3rd Saturday of every month.  Must not qualify for public or private insurance (i.e. Medicaid, Medicare, [place] Health Choice, Veterans' Benefits)  Household income should be no more than 200% of the poverty level The clinic cannot treat you if you are pregnant or think you are pregnant  Sexually transmitted diseases are not treated at the clinic.    Dental Care: Organization         Address  Phone  Notes  [facility] [phone] Accepts children up to age 11 who are enrolled in [place] or [place]; pregnant women with a Medicaid card; and children who have applied for Medicaid or [place] Health Choice, but were declined, whose parents can pay a reduced fee at time of service.  [employer] [facility] of [facility]  [address], [place] [phone] Accepts children up to age 40 who are enrolled in [place] or [place]; pregnant women with a Medicaid card; and children who have applied for Medicaid or [place] Health Choice, but were declined, whose parents can pay a reduced fee at time of service.  [facility]  [place] [phone] Patients are seen by appointment only. Walk-ins are not accepted. [place] will see patients 6 years of age and older. Monday - Tuesday (8am-5pm) Most Wednesdays (8:30-5pm) $30 per visit, cash only  [facility]  [address], [facility] [phone] Patients are seen by appointment only. Walk-ins are not accepted. [place] will see patients 54 years of age and older. One Wednesday Evening (Monthly: Volunteer Based).  $30 per visit, cash only  [place] [place]  [phone] for adults; Children under age 85, call Graduate Pediatric Dentistry at [phone]. Children aged 43-14, please call [phone] to request a pediatric application.  Dental services are provided in all areas of dental care including fillings, crowns and bridges, complete and partial dentures, implants, gum treatment, root canals, and extractions. Preventive care is also provided. Treatment is provided to both adults and children. Patients are selected via a lottery and there is often a waiting list.   [facility] [address] [place]  [phone] [email or web address]   [facility]  [address] [place], [place] [phone], Ext. [phone] Second and Fourth Thursday of each month, opens at 6:30 AM; Clinic ends at 9 AM.  Patients are seen on a first-come first-served basis, and a limited number are seen during each clinic.   [facility]  [address] Hillard Danker [place], [place] [phone]   Eligibility Requirements You must have lived in [place], [place], or [place] counties for at least the last three months.   You cannot be eligible for  state or Teacher, musicfederal sponsored healthcare insurance, including CIGNAVeterans Administration, [place]icaid, or Harrah[employer] EntertainmentMedicare.   You generally cannot be eligible for healthcare insurance through your employer.    How to apply: Eligibility screenings are held every Tuesday and Wednesday afternoon from 1:00 pm until 4:00 pm. You do not need an appointment for the interview!  [facility] [address], [place], [place] [phone]   [facility]  [phone]   [facility]  [phone]   [facility]  [phone]    Behavioral Health Resources in the Community: Intensive Outpatient Programs Organization         Address  Phone  Notes  [facility] [address], [place], [place] [phone]   [facility] [address], [place], [place] [phone]   [facility] [address], [place], [place]  [phone]   [facility] [address],  [place], [place] [phone] or [phone]   Substance Abuse Resources Organization         Address  Phone  Notes  [facility]  [phone]   [facility]  [phone]   [facility]  [phone]   [facility]  [phone]   Residential & Outpatient Substance Abuse Program  [phone]   Psychological Services Organization         Address  Phone  Notes  [facility]  [phone]    [facility]  [phone]- [phone]   [facility] [address], [place] [phone] or [phone]    Mobile Crisis Teams Organization         Address  Phone  Notes  [facility], Mobile Crisis Care Unit  [phone]   [facility]  [address] [place], [place] [phone]   Doristine LocksSharon DeEsch [address] [place] [phone]    Self-Help/Support Groups Organization         Address  Phone             Notes  [facility] [place] - variety of support groups  336- [phone] Call for more information  Narcotics Anonymous (NA), [facility]aring [facility] [address], [place]  2 meetings at this location   Statisticianesidential Treatment Programs Organization         Address  Phone  Notes  [facility] [address],    [place]  [phone]   [facility]  [address], [place] [phone], [place], [place] [phone]   [facility] [address], [place] [phone] Admissions: 8am-3pm M-F  [facility] [address]    [place], [place] [phone]   [facility] [address], [place], [place] [phone]   [facility] [address]  [place], [place] [phone]   [facility] - Intensive Outpatient [address], [place], [place] [phone]   [facility] ([facility] [address]  [place], [place] [phone] or [phone]   [facility] (RTS) [address] [place], [place] [phone] Accepts Medicaid  [facility] [address]  [place] [phone] Substance Abuse/Addiction Treatment   [facility] Resources Organization         Address  Phone  Notes  [facility]  [phone]) [phone]   Angie FavaJulie Brannon, PhD [address] [place], [place]   [phone]  [phone] or [phone]) [phone]    [facility]   [address] [address] [place], [place] [phone]   [facility] [address], [place], [place] [phone]) [phone] Insurance/Medicaid/sponsorship through [facility]  Faith and Families [address]                                    [place], [place] [phone] Therapy/tele-psych/case  [facility] [address]   [place], [place] [phone](336) [phone]    Dr. Lolly MustacheArfeen  [phone]   [facility]  United Way [facility] 1) [address], [place] 2) [address], [place] 3)  [address], [place] [phone]  [phone](336) [phone]   [facility] Child Abuse Hotline [phone]) [phone] or [phone]) [phone] (After Hours)

## 2014-01-25 NOTE — ED Provider Notes (Signed)
Medical screening examination/treatment/procedure(s) were performed by non-physician practitioner and as supervising physician I was immediately available for consultation/collaboration.   EKG Interpretation None       Susan Arana R. Damonie Ellenwood, MD 01/25/14 0703 

## 2014-02-01 ENCOUNTER — Ambulatory Visit: Payer: Medicaid Other | Admitting: Advanced Practice Midwife

## 2014-07-29 ENCOUNTER — Encounter (HOSPITAL_COMMUNITY): Payer: Self-pay | Admitting: Emergency Medicine

## 2014-08-28 ENCOUNTER — Encounter: Payer: Self-pay | Admitting: Obstetrics & Gynecology

## 2015-07-26 IMAGING — US US OB COMP +14 WK
1 series · 12 of 28 positions shown · non-contrast
Comparison: none

[Series 1: us ob comp +14 wk · 12 of 76 slices shown]
[im 3/76]
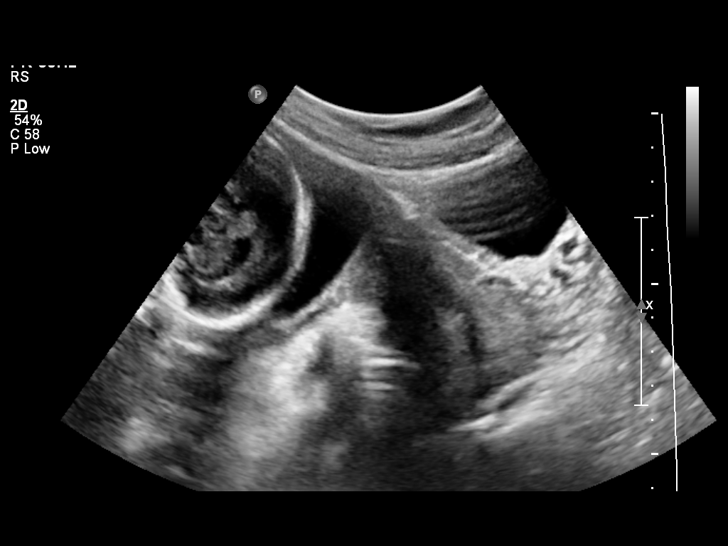
[im 9/76]
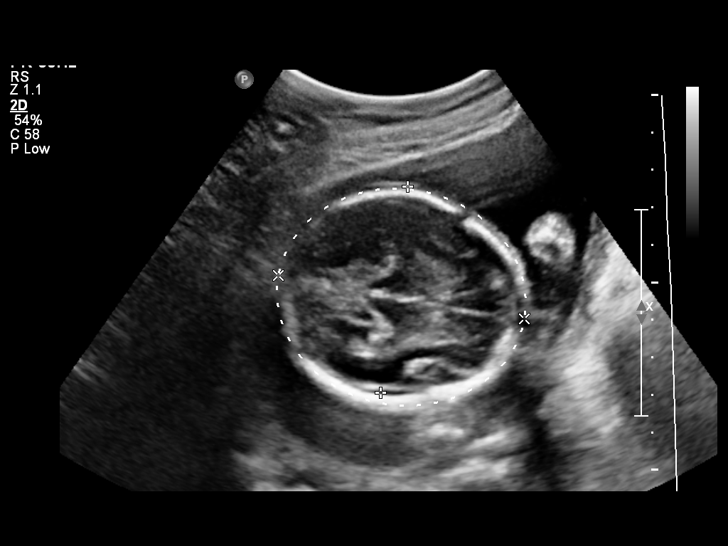
[im 14/76]
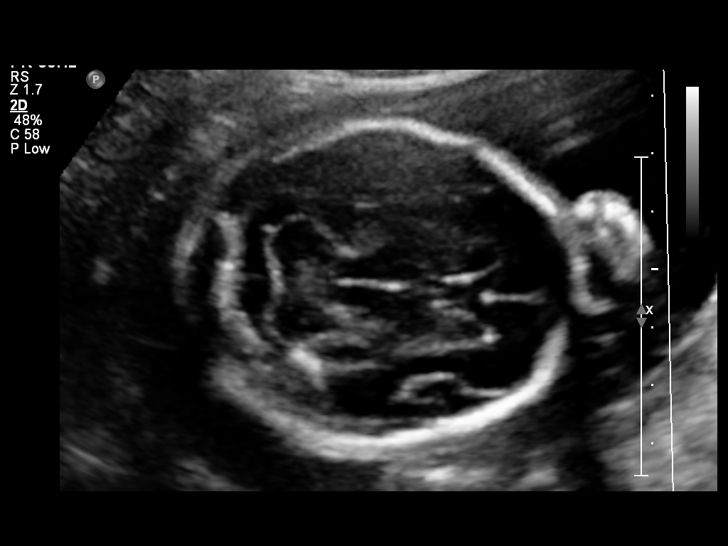
[im 23/76]
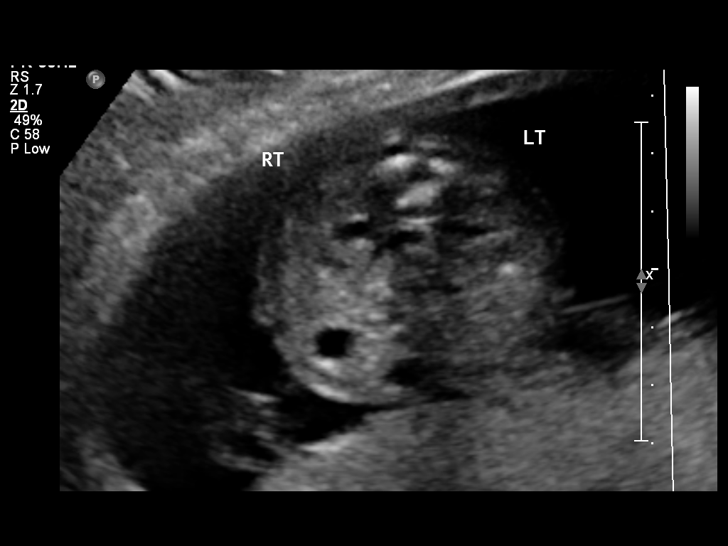
[im 28/76]
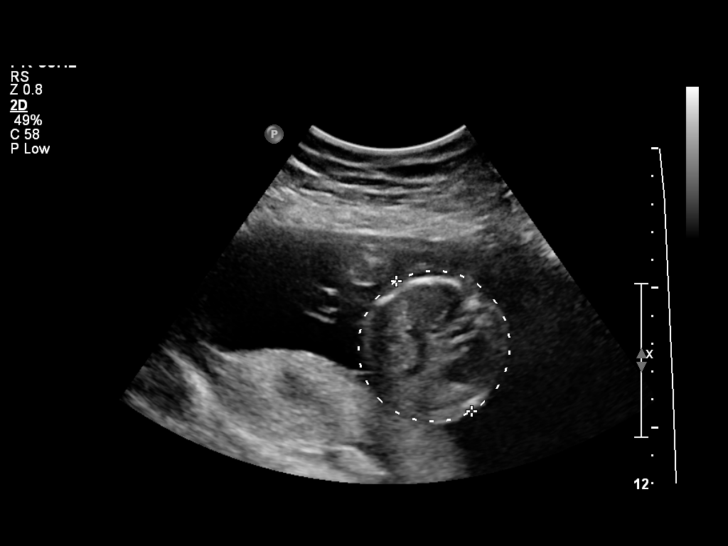
[im 34/76]
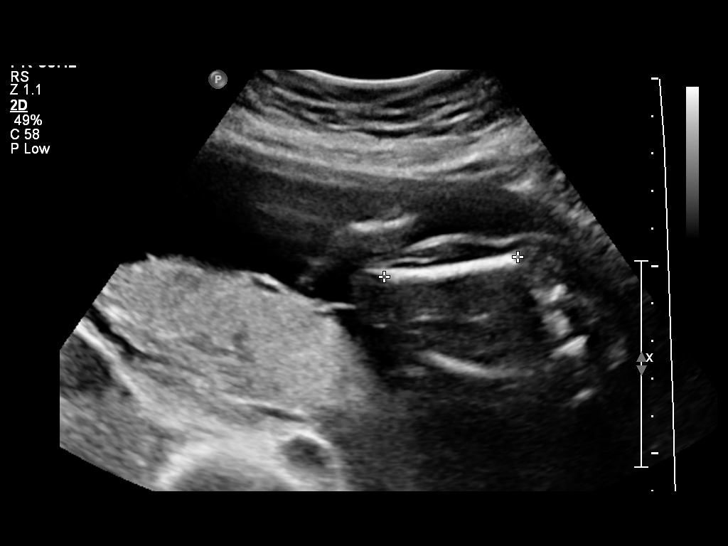
[im 42/76]
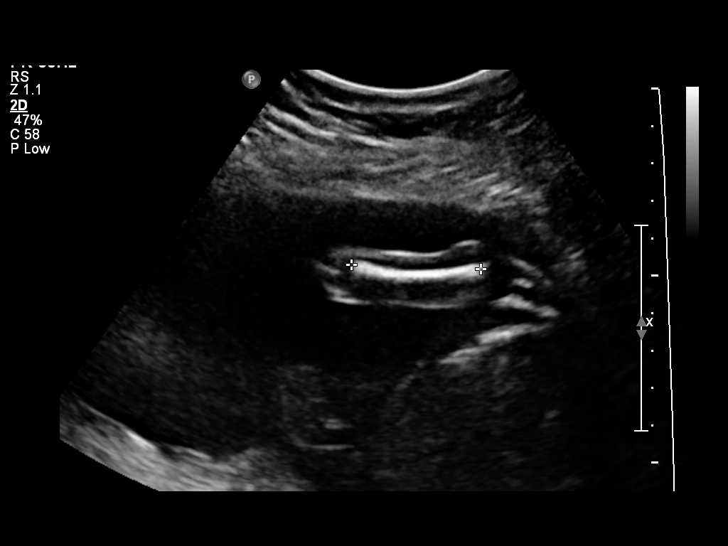
[im 48/76]
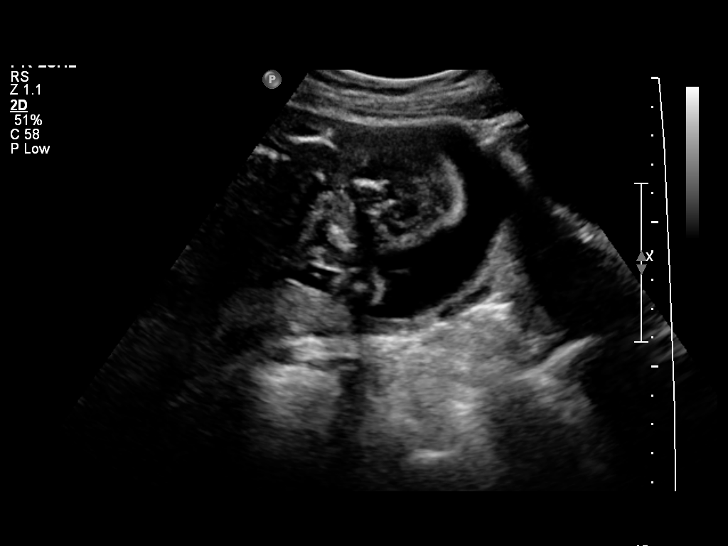
[im 53/76]
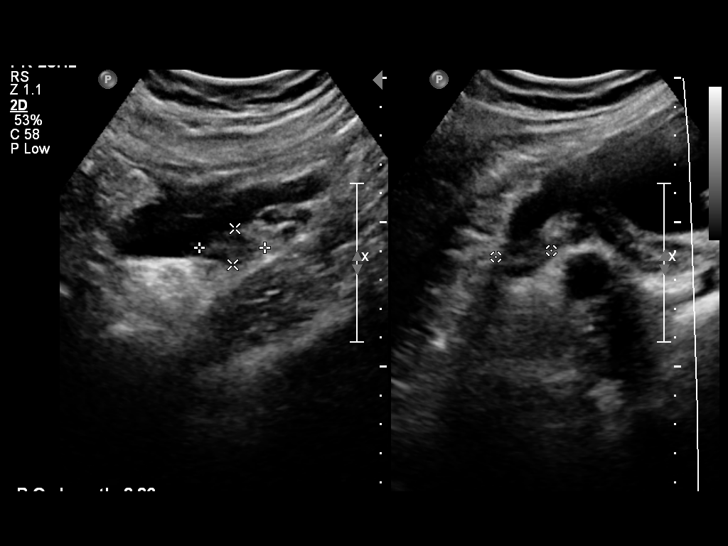
[im 62/76]
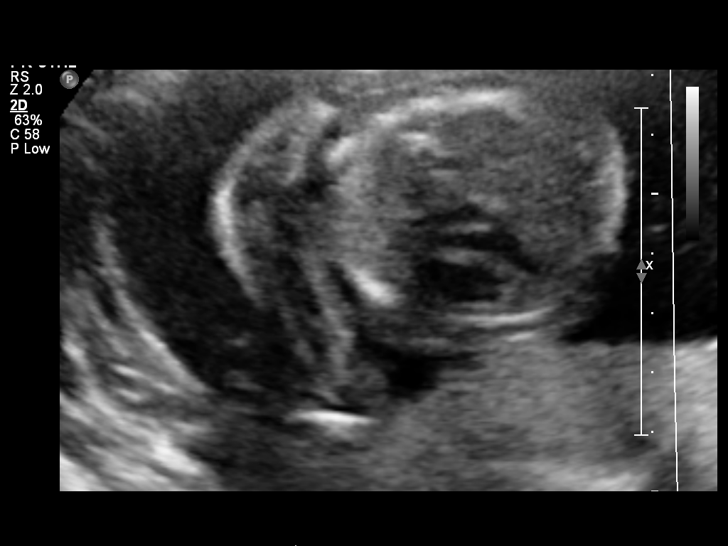
[im 67/76]
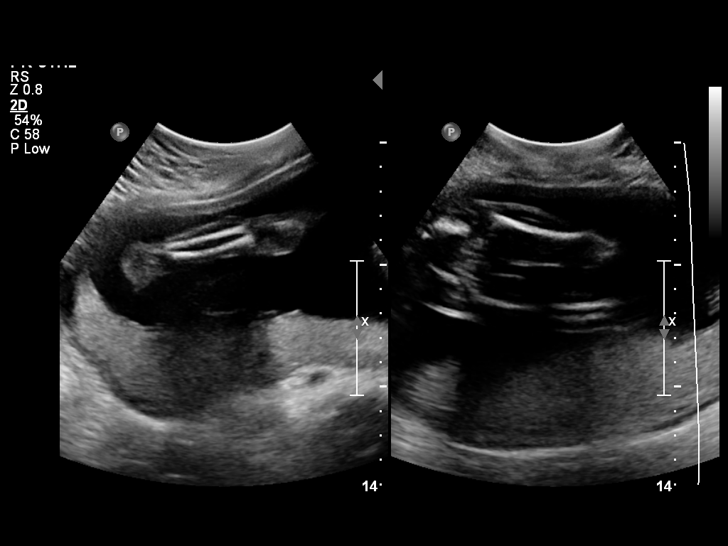
[im 73/76]
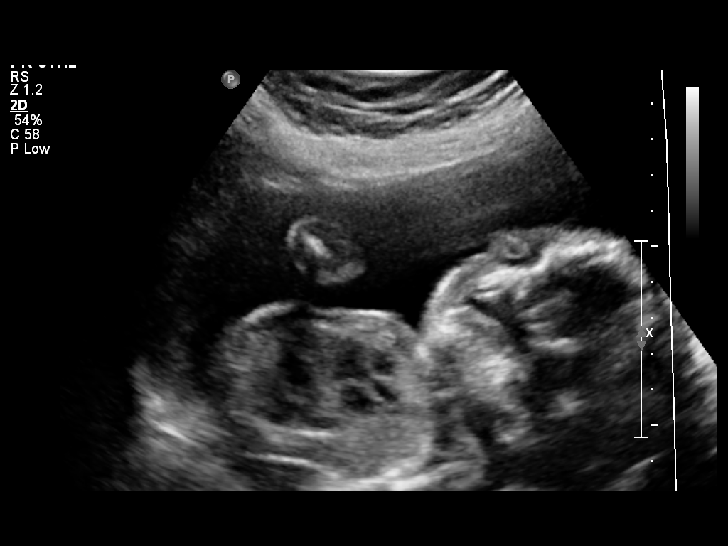

[12 of 28 positions shown; findings below may reference images not displayed]

OBSTETRICS REPORT
                      (Signed Final 06/26/2013 [DATE])

Service(s) Provided

 US OB COMP + 14 WK                                    76805.1
Indications

 No or Little Prenatal Care
 Basic anatomic survey
Fetal Evaluation

 Num Of Fetuses:    1
 Preg. Location:    Intrauterine
 Fetal Heart Rate:  143                          bpm
 Cardiac Activity:  Observed
 Presentation:      Cephalic
 Placenta:          Posterior, above cervical
                    os

 Amniotic Fluid
 AFI FV:      Subjectively within normal limits
                                             Larg Pckt:    6.04  cm
Biometry

 BPD:     55.6  mm     G. Age:  23w 0d                CI:         87.1   70 - 86
 OFD:     63.8  mm                                    FL/HC:      19.3   15.9 -

 HC:     194.1  mm     G. Age:  21w 4d       75  %    HC/AC:      1.12   1.06 -

 AC:       173  mm     G. Age:  22w 1d       83  %    FL/BPD:
 FL:      37.4  mm     G. Age:  22w 0d       77  %    FL/AC:      21.6   20 - 24
 HUM:     34.1  mm     G. Age:  21w 4d       67  %
 CER:     22.6  mm     G. Age:  21w 2d       61  %

 Est. FW:     475  gm      1 lb 1 oz     62  %
Gestational Age

 LMP:           20w 6d        Date:  01/31/13                 EDD:   11/07/13
 U/S Today:     22w 1d                                        EDD:   10/29/13
 Best:          20w 6d     Det. By:  LMP  (01/31/13)          EDD:   11/07/13
Anatomy
 Cranium:          Appears normal         Ductal Arch:      Not well visualized
 Fetal Cavum:      Appears normal         Diaphragm:        Appears normal
 Ventricles:       Appears normal         Stomach:          Appears normal, left
                                                            sided
 Choroid Plexus:   Appears normal         Abdomen:          Appears normal
 Cerebellum:       Appears normal         Abdominal Wall:   Appears nml (cord
                                                            insert, abd wall)
 Posterior Fossa:  Appears normal         Cord Vessels:     Appears normal (3
                                                            vessel cord)
 Nuchal Fold:      Not applicable (>20    Kidneys:          Bilat pyelectasis,
                   wks GA)
                                                            Rt 4 mm, Lt 3 mm
 Lips:             Appears normal         Bladder:          Appears normal
 Heart:            Appears normal         Spine:            Appears normal
                   (4CH, axis, and
                   situs)
 RVOT:             Appears normal         Lower             Appears normal
                                          Extremities:
 LVOT:             Appears normal         Upper             Appears normal
                                          Extremities:
 Aortic Arch:      Appears normal

 Other:  Fetus appears to be a male. Heels and 5th digit appear normal.
Targeted Anatomy

 Fetal Central Nervous System
 Lat. Ventricles:  7.2                    Cisterna Magna:
Cervix Uterus Adnexa

 Cervical Length:    4.99     cm

 Cervix:       Normal appearance by transabdominal scan.
 Uterus:       No abnormality visualized.
 Cul De Sac:   No free fluid seen.

 Left Ovary:    Size(cm) L: 2.75 x W: 2.35 x H: 1.42  Volume(cc):
 Right Ovary:   Size(cm) L: 2.26 x W: 1.93 x H: 1.25  Volume(cc):
 Adnexa:     No adnexal mass visualized.
Comments

 Mild right renal pylectasis was seen (4 mm).  Although
 pylectasis may be associated with an increased risk of Down
 syndrome, this risk is felt to be minimal, especially when it is
 seen as an isolated finding.  It may also be a sign of a
 congenital anomaly of the kidneys and urinary tract,
 especially if the pylectasis worsens overtime.  If persistant or
 worsened at 32 weeks of gestation, a referral to Pediatric
 Urology should be considered.
Impression

 Single IUP at 20 [DATE] weeks
 Mild right renal pylectasis without calyceal dilation
 The remainder of the fetal anatomy was within normal limits
 No other markers associated with aneuploidy was seen
 Normal amniotic fluid volume
Recommendations

 Recommend follow-up ultrasound examination at
 approximately 30 weeks to reevaluate the fetal kidneys.

 questions or concerns.

## 2015-10-21 IMAGING — US US OB FOLLOW-UP
2 series · 12 of 28 positions shown · non-contrast
Comparison: none

[Series 1: us ob follow up · 10 of 51 slices shown (1 of 2)]
[im 3/51]
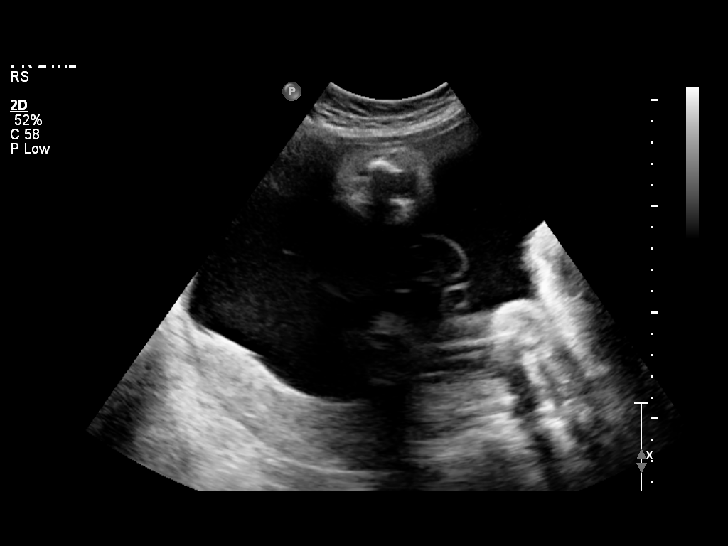
[im 7/51]
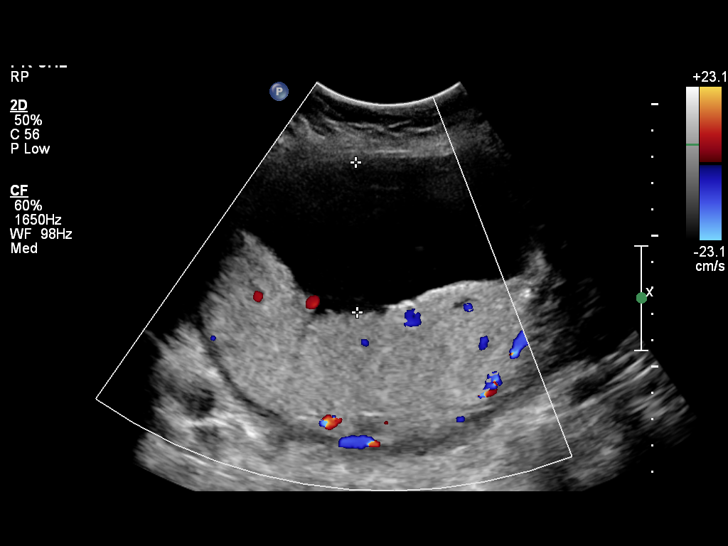
[im 11/51]
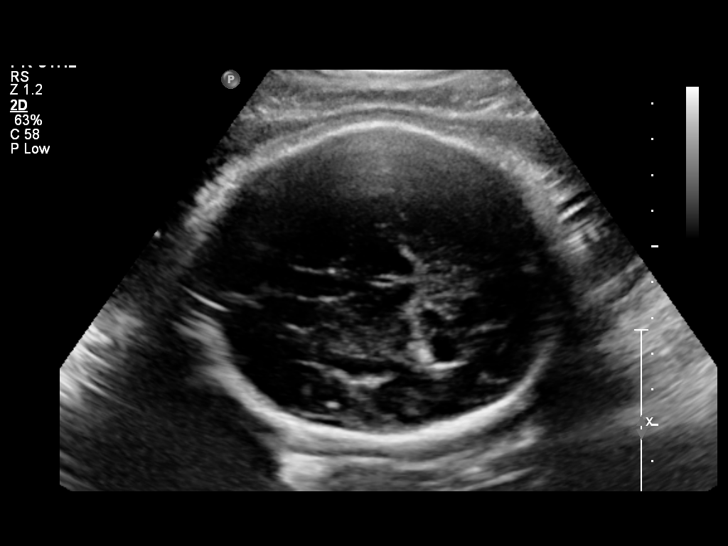
[im 18/51]
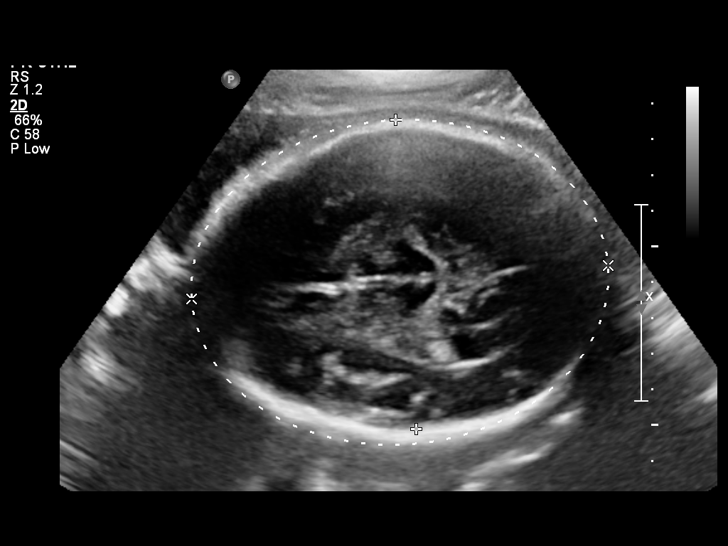
[im 22/51]
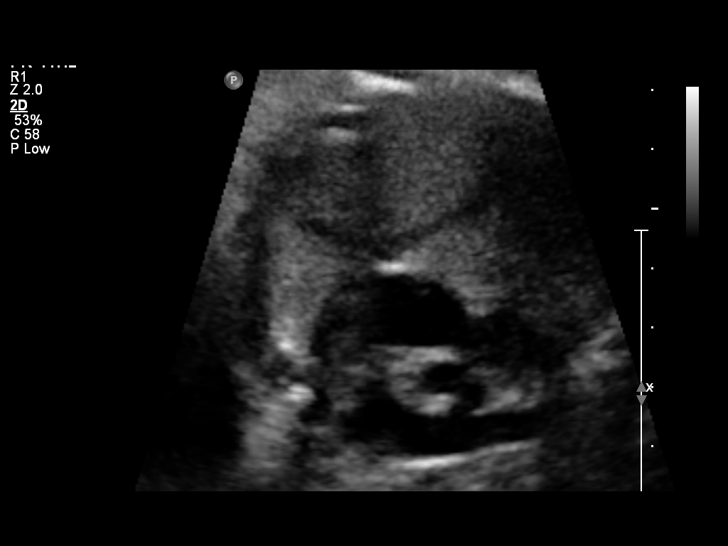
[im 27/51]
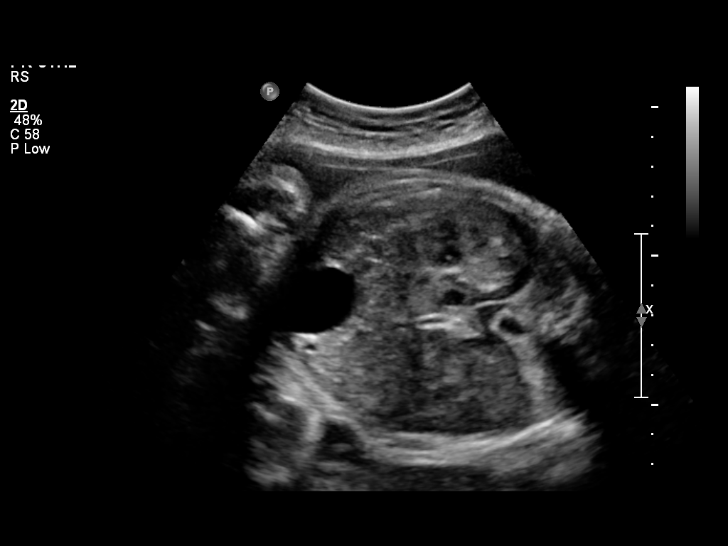
[im 33/51]
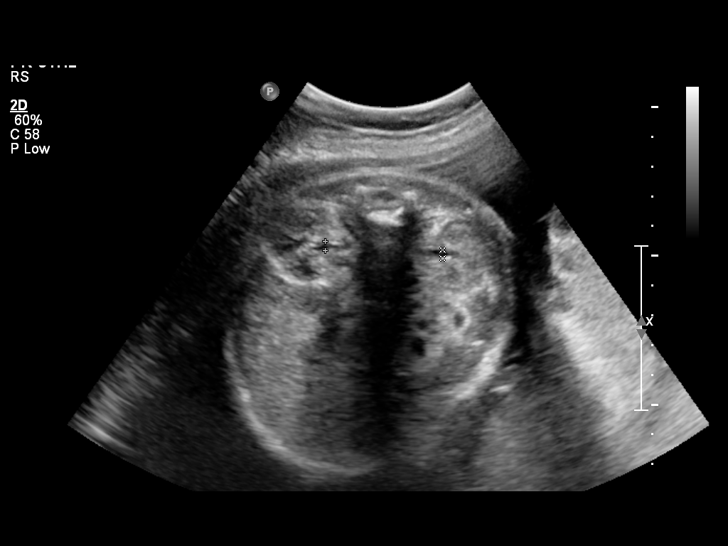
[im 37/51]
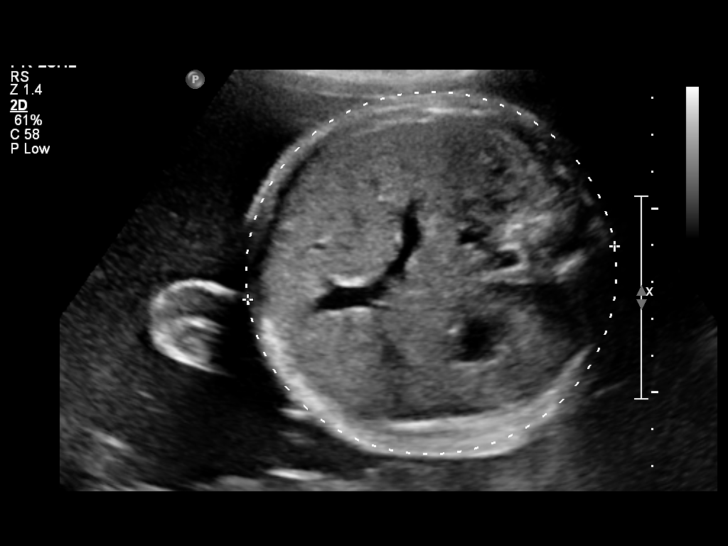
[im 42/51]
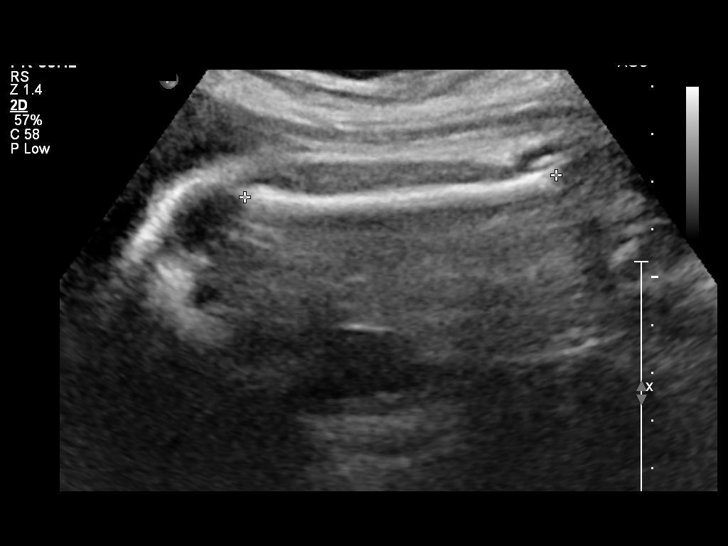
[im 48/51]
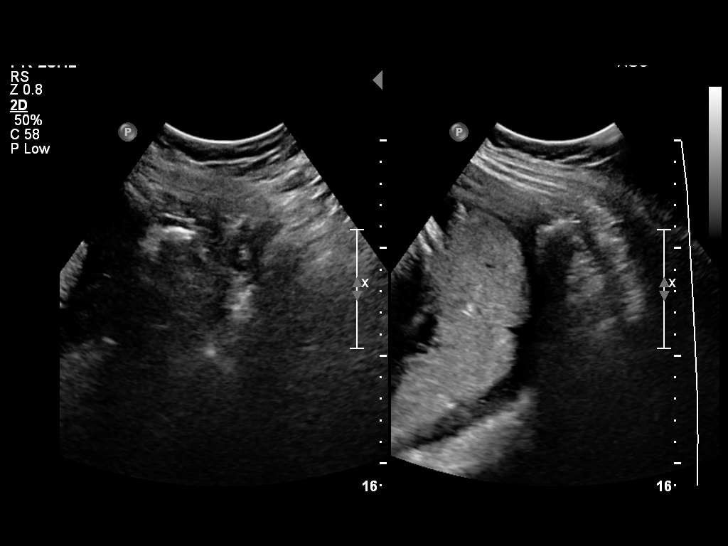

[Series 1: us ob follow up · 8 acquisitions, 2 frames shown (2 of 2)]
[im 1/8]
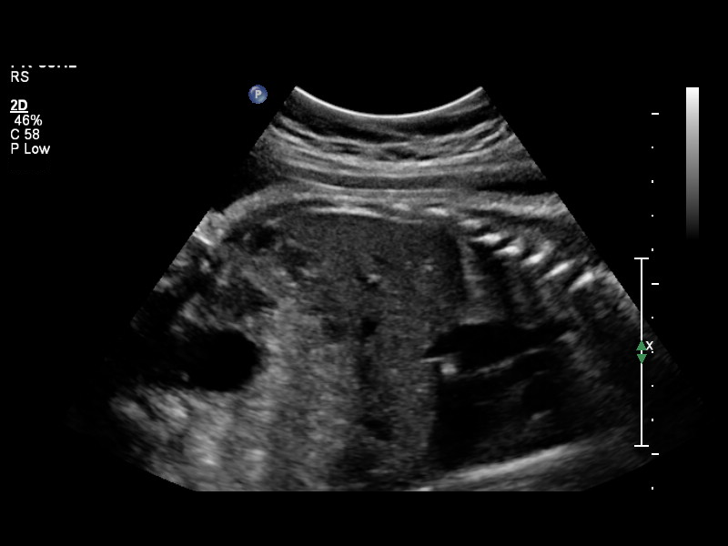
[im 5/8]
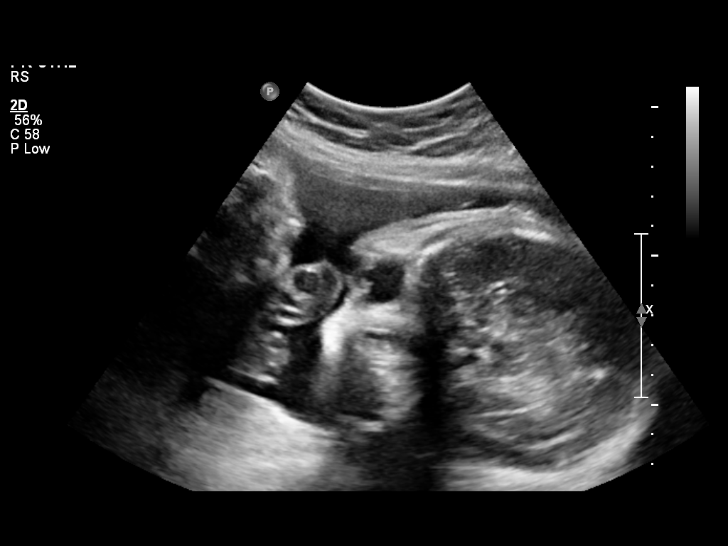

[12 of 28 positions shown; findings below may reference images not displayed]

OBSTETRICS REPORT
                      (Signed Final 09/23/2013 [DATE])

Service(s) Provided

 US OB FOLLOW UP                                       76816.1
Indications

 Fetal abnormality - (right renal pyelectasis)
Fetal Evaluation

 Num Of Fetuses:    1
 Fetal Heart Rate:  135                          bpm
 Cardiac Activity:  Observed
 Presentation:      Cephalic
 Placenta:          Posterior, above cervical
                    os
 P. Cord            Previously Visualized
 Insertion:

 Amniotic Fluid
 AFI FV:      Polyhydramnios
 AFI Sum:     32.23   cm     > 97  %Tile      Larg Pckt:  11.82  cm
 RUQ:   11.82   cm   RLQ:    8.43   cm    LUQ:   5.69    cm    LLQ:   6.29    cm
Biophysical Evaluation

 Amniotic F.V:   Polyhydramnios             F. Tone:         Observed
 F. Movement:    Observed                   Score:           [DATE]
 F. Breathing:   Observed
Biometry

 BPD:     86.9  mm     G. Age:  35w 0d                CI:        71.18   70 - 86
                                                      FL/HC:       19.6  19.9 -

 HC:     328.1  mm     G. Age:  37w 2d       96   %   HC/AC:       1.05  0.96 -

 AC:     311.2  mm     G. Age:  35w 0d       91   %   FL/BPD:      74.1  71 - 87
 FL:      64.4  mm     G. Age:  33w 2d       38   %   FL/AC:       20.7  20 - 24
 HUM:     56.4  mm     G. Age:  32w 6d       48   %

 Est. FW:    0275   gm     5 lb 9 oz     81  %
Gestational Age

 LMP:           33w 2d        Date:  01/31/13                 EDD:   11/07/13
 U/S Today:     35w 1d                                        EDD:   10/25/13
 Best:          33w 2d     Det. By:  LMP  (01/31/13)          EDD:   11/07/13
Anatomy

 Cranium:          Appears normal         Aortic Arch:      Previously seen
 Fetal Cavum:      Appears normal         Ductal Arch:      Not well visualized
 Ventricles:       Appears normal         Diaphragm:        Previously seen
 Choroid Plexus:   Previously seen        Stomach:          Appears normal, left
                                                            sided
 Cerebellum:       Appears normal         Abdomen:          Appears normal
 Posterior Fossa:  Appears normal         Abdominal Wall:   Previously seen
 Nuchal Fold:      Not applicable (>20    Cord Vessels:     Previously seen
                   wks GA)
 Face:             Appears normal         Kidneys:          Appear normal
                   (orbits and profile)
 Lips:             Appears normal         Bladder:          Appears normal
 Heart:            Appears normal         Spine:            Previously seen
                   (4CH, axis, and
                   situs)
 RVOT:             Appears normal         Lower             Previously seen
                                          Extremities:
 LVOT:             Appears normal         Upper             Previously seen
                                          Extremities:

 Other:  Fetus appears to be a male. Heels and 5th digit previously visualized.
         Technically difficult due to advanced GA.
Cervix Uterus Adnexa

 Cervix:       Not visualized (advanced GA >59wks)
 Uterus:       No abnormality visualized.
 Cul De Sac:   No free fluid seen.

 Left Ovary:    Not visualized.
 Right Ovary:   Within normal limits.
 Adnexa:     No abnormality visualized.
Impression

 IUP at 33+3 weeks
 Normal interval anatomy; anatomic survey complete except
 for DA; pyelectasis resolved; no structural fetal abnormalities
 were identified to explain the increased AFI
 Polyhydramnios
 Appropriate interval growth with EFW at the 81st %tile
 BPP [DATE]
Recommendations

 Begin twice weekly NSTs with weekly AFIs

 questions or concerns.
